# Patient Record
Sex: Male | Born: 1960 | Race: Black or African American | Hispanic: No | Marital: Single | Smoking: Never smoker
Health system: Southern US, Community
[De-identification: ages and names within clinical notes are randomized; demographics above are authoritative.]

## PROBLEM LIST (undated history)

## (undated) DIAGNOSIS — I10 Essential (primary) hypertension: Secondary | ICD-10-CM

## (undated) DIAGNOSIS — E785 Hyperlipidemia, unspecified: Secondary | ICD-10-CM

## (undated) HISTORY — DX: Hyperlipidemia, unspecified: E78.5

## (undated) HISTORY — DX: Essential (primary) hypertension: I10

---

## 2010-10-19 ENCOUNTER — Emergency Department (HOSPITAL_COMMUNITY): Payer: Self-pay

## 2010-10-19 ENCOUNTER — Emergency Department (HOSPITAL_COMMUNITY)
Admission: EM | Admit: 2010-10-19 | Discharge: 2010-10-19 | Disposition: A | Discharge status: Home / Self Care | Payer: Self-pay | Attending: Emergency Medicine | Admitting: Emergency Medicine

## 2010-10-19 DIAGNOSIS — R197 Diarrhea, unspecified: Secondary | ICD-10-CM | POA: Insufficient documentation

## 2010-10-19 DIAGNOSIS — R062 Wheezing: Secondary | ICD-10-CM | POA: Insufficient documentation

## 2010-10-19 DIAGNOSIS — Z79899 Other long term (current) drug therapy: Secondary | ICD-10-CM | POA: Insufficient documentation

## 2010-10-19 DIAGNOSIS — R059 Cough, unspecified: Secondary | ICD-10-CM | POA: Insufficient documentation

## 2010-10-19 DIAGNOSIS — B9789 Other viral agents as the cause of diseases classified elsewhere: Secondary | ICD-10-CM | POA: Insufficient documentation

## 2010-10-19 DIAGNOSIS — R5381 Other malaise: Secondary | ICD-10-CM | POA: Insufficient documentation

## 2010-10-19 DIAGNOSIS — I10 Essential (primary) hypertension: Secondary | ICD-10-CM | POA: Insufficient documentation

## 2010-10-19 DIAGNOSIS — R0989 Other specified symptoms and signs involving the circulatory and respiratory systems: Secondary | ICD-10-CM | POA: Insufficient documentation

## 2010-10-19 DIAGNOSIS — R05 Cough: Secondary | ICD-10-CM | POA: Insufficient documentation

## 2010-10-19 DIAGNOSIS — R509 Fever, unspecified: Secondary | ICD-10-CM | POA: Insufficient documentation

## 2010-10-19 DIAGNOSIS — J40 Bronchitis, not specified as acute or chronic: Secondary | ICD-10-CM | POA: Insufficient documentation

## 2010-10-19 DIAGNOSIS — J3489 Other specified disorders of nose and nasal sinuses: Secondary | ICD-10-CM | POA: Insufficient documentation

## 2010-10-19 LAB — COMPREHENSIVE METABOLIC PANEL
ALT: 20 U/L (ref 0–53)
AST: 32 U/L (ref 0–37)
Albumin: 3.5 g/dL (ref 3.5–5.2)
Alkaline Phosphatase: 60 U/L (ref 39–117)
BUN: 8 mg/dL (ref 6–23)
Chloride: 100 mEq/L (ref 96–112)
GFR calc Af Amer: >60 mL/min (ref >60)
Potassium: 3.9 mEq/L (ref 3.5–5.1)
Sodium: 133 mEq/L — ABNORMAL LOW (ref 135–145)
Total Protein: 7.5 g/dL (ref 6.0–8.3)

## 2010-10-19 LAB — CBC
Platelets: 217 10*3/uL (ref 150–400)
RBC: 4.49 MIL/uL (ref 4.22–5.81)
RDW: 13.6 % (ref 11.5–15.5)
WBC: 7.2 10*3/uL (ref 4.0–10.5)

## 2010-10-19 LAB — DIFFERENTIAL
Basophils Absolute: 0 10*3/uL (ref 0.0–0.1)
Basophils Relative: 0 % (ref 0–1)
Eosinophils Absolute: 0 10*3/uL (ref 0.0–0.7)
Eosinophils Relative: 0 % (ref 0–5)
Neutrophils Relative %: 75 % (ref 43–77)

## 2017-07-05 ENCOUNTER — Emergency Department (HOSPITAL_COMMUNITY)
Admission: EM | Admit: 2017-07-05 | Discharge: 2017-07-05 | Disposition: A | Discharge status: Home / Self Care | Payer: Self-pay | Attending: Emergency Medicine | Admitting: Emergency Medicine

## 2017-07-05 ENCOUNTER — Encounter (HOSPITAL_COMMUNITY): Payer: Self-pay | Admitting: Emergency Medicine

## 2017-07-05 DIAGNOSIS — Z79899 Other long term (current) drug therapy: Secondary | ICD-10-CM | POA: Insufficient documentation

## 2017-07-05 DIAGNOSIS — K625 Hemorrhage of anus and rectum: Secondary | ICD-10-CM | POA: Insufficient documentation

## 2017-07-05 LAB — COMPREHENSIVE METABOLIC PANEL
ALBUMIN: 3.9 g/dL (ref 3.5–5.0)
ALK PHOS: 79 U/L (ref 38–126)
ALT: 29 U/L (ref 17–63)
ANION GAP: 6 (ref 5–15)
AST: 28 U/L (ref 15–41)
BILIRUBIN TOTAL: 0.6 mg/dL (ref 0.3–1.2)
BUN: 14 mg/dL (ref 6–20)
CALCIUM: 9.4 mg/dL (ref 8.9–10.3)
CO2: 27 mmol/L (ref 22–32)
Chloride: 104 mmol/L (ref 101–111)
Creatinine, Ser: 0.95 mg/dL (ref 0.61–1.24)
GFR calc Af Amer: >60 mL/min (ref >60)
GLUCOSE: 93 mg/dL (ref 65–99)
POTASSIUM: 3.7 mmol/L (ref 3.5–5.1)
Sodium: 137 mmol/L (ref 135–145)
TOTAL PROTEIN: 7.6 g/dL (ref 6.5–8.1)

## 2017-07-05 LAB — CBC
HCT: 39 % (ref 39.0–52.0)
HEMOGLOBIN: 12.8 g/dL — AB (ref 13.0–17.0)
MCH: 30.8 pg (ref 26.0–34.0)
MCHC: 32.8 g/dL (ref 30.0–36.0)
MCV: 93.8 fL (ref 78.0–100.0)
Platelets: 213 10*3/uL (ref 150–400)
RBC: 4.16 MIL/uL — ABNORMAL LOW (ref 4.22–5.81)
RDW: 13.1 % (ref 11.5–15.5)
WBC: 4.5 10*3/uL (ref 4.0–10.5)

## 2017-07-05 LAB — TYPE AND SCREEN
ABO/RH(D): O POS
ANTIBODY SCREEN: NEGATIVE

## 2017-07-05 LAB — POC OCCULT BLOOD, ED: Fecal Occult Bld: POSITIVE — AB

## 2017-07-05 LAB — ABO/RH: ABO/RH(D): O POS

## 2017-07-05 MED ORDER — POLYETHYLENE GLYCOL 3350 17 GM/SCOOP PO POWD: 17.0000 g | Freq: Every day | ORAL | 0 refills | Status: DC

## 2017-07-05 NOTE — Discharge Instructions (Signed)
Your rectal bleeding is most likely due to hemorrhoids.  Please take MiraLAX to help soften your stools more.  This should help with the bleeding.  You have also been given the phone number to call a gastroenterologist for further workup of your bleeding if it is persistent or worsening.  Please return without fail for worsening symptoms, including passing out, extreme fatigue, worsening bleeding, black or tarry stools, severe abdominal pains, intractable vomiting, or any other symptoms concerning to you

## 2017-07-05 NOTE — ED Notes (Signed)
Pt reports being concerned that he has a lump in his groin area. He states it is sometimes there and sometimes is goes away. Pt also reports he "has a touch of bronchitis" and "coughs up white stuff".

## 2017-07-05 NOTE — ED Provider Notes (Signed)
[facility]-EMERGENCY DEPT Provider Note   CSN: [id number] Arrival date & time: 07/05/17  1001     History   Chief Complaint Chief Complaint  Patient presents with  . Rectal Bleeding    HPI Jake Fisher is a 56 y.o. male.  HPI 56 year old male who presents with bright red blood per rectum for 5 days with after bowel movement. Noticed blood that lightly stains toilet bowel water and with wiping with toilet paper.  Denies melena or hematochezia, abdominal pain, fevers or chills, nausea or vomiting. Does not take anticoagulants.  Does also report that when he bears down to have a bowel movement he notices a slight swelling in the left groin.  It is not painful, and it goes away on its own.  Denies abdominal distention, or any urinary complaints.  Denies testicular swelling or pain. Denies constipation or diarrhea.   History reviewed. No pertinent past medical history.  There are no active problems to display for this patient.   History reviewed. No pertinent surgical history.     Home Medications    Prior to Admission medications   Medication Sig Start Date End Date Taking? Authorizing Provider  COD LIVER OIL PO Take 2-3 capsules by mouth daily.   Yes [provider]  Ferrous Sulfate (FERRA PO) Take 5 mLs by mouth daily after supper.   Yes [provider]  polyethylene glycol powder (MIRALAX) powder Take 17 g by mouth daily. 07/05/17   Lavera GuiseLiu, Ronnie Doo Duo, MD    Family History No family history on file.  Social History Social History   Tobacco Use  . Smoking status: Never Smoker  Substance Use Topics  . Alcohol use: Yes  . Drug use: No     Allergies   Patient has no known allergies.   Review of Systems Review of Systems  Respiratory: Negative for shortness of breath.   Cardiovascular: Negative for chest pain.  Gastrointestinal: Positive for blood in stool. Negative for abdominal pain, diarrhea, nausea, rectal pain and  vomiting.  Genitourinary: Negative for dysuria and frequency.  Hematological: Does not bruise/bleed easily.  All other systems reviewed and are negative.    Physical Exam Updated Vital Signs BP 127/69 (BP Location: Right Arm)   Pulse 66   Temp 98.4 F (36.9 C) (Oral)   Resp 16   Ht 5\' 7"  (1.702 m)   Wt 93 kg (205 lb)   SpO2 100%   BMI 32.11 kg/m   Physical Exam Physical Exam  Nursing note and vitals reviewed. Constitutional: Well developed, well nourished, non-toxic, and in no acute distress Head: Normocephalic and atraumatic.  Mouth/Throat: Oropharynx is clear and moist.  Neck: Normal range of motion. Neck supple.  Cardiovascular: Normal rate and regular rhythm.   Pulmonary/Chest: Effort normal and breath sounds normal.  Abdominal: Soft. There is no tenderness. There is no rebound and no guarding. small external hemorrhoid, not thrombosed. No fissures. No stool in rectum Musculoskeletal: Normal range of motion.  Neurological: Alert, no facial droop, fluent speech, moves all extremities symmetrically Skin: Skin is warm and dry.  Psychiatric: Cooperative   ED Treatments / Results  Labs (all labs ordered are listed, but only abnormal results are displayed) Labs Reviewed  CBC - Abnormal; Notable for the following components:      Result Value   RBC 4.16 (*)    Hemoglobin 12.8 (*)    All other components within normal limits  POC OCCULT BLOOD, ED - Abnormal; Notable for  the following components:   Fecal Occult Bld POSITIVE (*)    All other components within normal limits  COMPREHENSIVE METABOLIC PANEL  TYPE AND SCREEN  ABO/RH    EKG  EKG Interpretation None       Radiology No results found.  Procedures Procedures (including critical care time)  Medications Ordered in ED Medications - No data to display   Initial Impression / Assessment and Plan / ED Course  I have reviewed the triage vital signs and the nursing notes.  Pertinent labs & imaging  results that were available during my care of the patient were reviewed by me and considered in my medical decision making (see chart for details).     56 year old male who presents with rectal bleeding.  He is well-appearing in no acute distress normal vital signs.  She is a soft and benign abdomen.  Small external hemorrhoid noted on exam without fissures.  No stools in the rectum, but small streak of mucus that is guaiac positive for blood.  Suspect that this is likely hemorrhoidal bleeding as it does sound like he strains with bowel movements.  He also likely has hernia in the left groin with straining although it is not appreciated on exam today. Soft and benign abdomen on exam.  Blood work reassuring.  Hgb 12.8, close to baseline.  I have started him on MiraLAX.  GI referral given as needed. Strict return and follow-up instructions reviewed. He expressed understanding of all discharge instructions and felt comfortable with the plan of care.   Final Clinical Impressions(s) / ED Diagnoses   Final diagnoses:  Bright red blood per rectum    ED Discharge Orders        Ordered    polyethylene glycol powder (MIRALAX) powder  Daily     07/05/17 1351       Lavera GuiseLiu, Flois Mctague Duo, MD 07/05/17 1353

## 2017-07-05 NOTE — ED Notes (Signed)
ED Provider at bedside. 

## 2017-07-05 NOTE — ED Triage Notes (Addendum)
Per pt, states he has been having blood in his stool for 5 days-states scant amount-states he is not sure if he has hemorrhoids-states also has palpable mass that "goes in and out" on left groin-no abdominal pain, no N/V-no dizziness

## 2019-05-08 ENCOUNTER — Other Ambulatory Visit: Payer: Self-pay

## 2019-05-08 DIAGNOSIS — Z20822 Contact with and (suspected) exposure to covid-19: Secondary | ICD-10-CM

## 2019-05-09 LAB — NOVEL CORONAVIRUS, NAA: SARS-CoV-2, NAA: NOT DETECTED

## 2019-05-15 ENCOUNTER — Other Ambulatory Visit: Payer: Self-pay

## 2019-05-15 DIAGNOSIS — Z20822 Contact with and (suspected) exposure to covid-19: Secondary | ICD-10-CM

## 2019-05-17 LAB — NOVEL CORONAVIRUS, NAA: SARS-CoV-2, NAA: NOT DETECTED

## 2019-05-21 ENCOUNTER — Other Ambulatory Visit: Payer: Self-pay

## 2019-05-21 DIAGNOSIS — Z20822 Contact with and (suspected) exposure to covid-19: Secondary | ICD-10-CM

## 2019-05-22 LAB — SPECIMEN STATUS REPORT

## 2019-05-22 LAB — NOVEL CORONAVIRUS, NAA: SARS-CoV-2, NAA: NOT DETECTED

## 2019-05-28 ENCOUNTER — Other Ambulatory Visit: Payer: Self-pay

## 2019-05-28 DIAGNOSIS — Z20822 Contact with and (suspected) exposure to covid-19: Secondary | ICD-10-CM

## 2019-05-30 LAB — NOVEL CORONAVIRUS, NAA: SARS-CoV-2, NAA: NOT DETECTED

## 2019-06-04 ENCOUNTER — Other Ambulatory Visit: Payer: Self-pay

## 2019-06-04 DIAGNOSIS — Z20822 Contact with and (suspected) exposure to covid-19: Secondary | ICD-10-CM

## 2019-06-05 LAB — NOVEL CORONAVIRUS, NAA: SARS-CoV-2, NAA: NOT DETECTED

## 2019-06-18 ENCOUNTER — Other Ambulatory Visit: Payer: Self-pay

## 2019-06-18 DIAGNOSIS — Z20822 Contact with and (suspected) exposure to covid-19: Secondary | ICD-10-CM

## 2019-06-19 LAB — NOVEL CORONAVIRUS, NAA: SARS-CoV-2, NAA: NOT DETECTED

## 2021-01-05 ENCOUNTER — Emergency Department (HOSPITAL_COMMUNITY)
Admission: EM | Admit: 2021-01-05 | Discharge: 2021-01-05 | Disposition: A | Discharge status: Home / Self Care | Payer: Self-pay | Attending: Emergency Medicine | Admitting: Emergency Medicine

## 2021-01-05 ENCOUNTER — Other Ambulatory Visit: Payer: Self-pay

## 2021-01-05 ENCOUNTER — Encounter (HOSPITAL_COMMUNITY): Payer: Self-pay | Admitting: *Deleted

## 2021-01-05 DIAGNOSIS — U071 COVID-19: Secondary | ICD-10-CM | POA: Insufficient documentation

## 2021-01-05 LAB — BASIC METABOLIC PANEL
Anion gap: 6 (ref 5–15)
BUN: 12 mg/dL (ref 6–20)
CO2: 29 mmol/L (ref 22–32)
Calcium: 8.7 mg/dL — ABNORMAL LOW (ref 8.9–10.3)
Chloride: 101 mmol/L (ref 98–111)
Creatinine, Ser: 1.05 mg/dL (ref 0.61–1.24)
GFR, Estimated: >60 mL/min (ref >60)
Glucose, Bld: 101 mg/dL — ABNORMAL HIGH (ref 70–99)
Potassium: 3.1 mmol/L — ABNORMAL LOW (ref 3.5–5.1)
Sodium: 136 mmol/L (ref 135–145)

## 2021-01-05 MED ORDER — NIRMATRELVIR/RITONAVIR (PAXLOVID)TABLET: 3.0000 | ORAL_TABLET | Freq: Two times a day (BID) | ORAL | 0 refills | Status: AC

## 2021-01-05 MED ORDER — BENZONATATE 100 MG PO CAPS: 100.0000 mg | ORAL_CAPSULE | Freq: Three times a day (TID) | ORAL | 0 refills | Status: DC

## 2021-01-05 NOTE — Discharge Instructions (Signed)
Please read and follow all provided instructions.  Your diagnoses today include:  1. COVID-19     Tests performed today include:  Vital signs. See below for your results today.   Kidney function - was normal  Medications prescribed:   Tessalon Perles - cough suppressant medication   Paxlovid - antiviral medication for covid  Take any prescribed medications only as directed. Treatment for your infection is aimed at treating the symptoms. There are no medications, such as antibiotics, that will cure your infection.   Home care instructions:  Follow any educational materials contained in this packet.   Your illness is contagious and can be spread to others, especially during the first 3 or 4 days. It cannot be cured by antibiotics or other medicines. Take basic precautions such as washing your hands often, covering your mouth when you cough or sneeze, and avoiding public places where you could spread your illness to others.   Please continue drinking plenty of fluids.  Use over-the-counter medicines as needed as directed on packaging for symptom relief.  You may also use ibuprofen or tylenol as directed on packaging for pain or fever.  Do not take multiple medicines containing Tylenol or acetaminophen to avoid taking too much of this medication.  If you are positive for Covid-19, you should isolate yourself and not be exposed to other people for 5 days after your symptoms began. If you are not feeling better at day 5, you need to isolate yourself for a total of 10 days. If you are feeling better by day 5, you should wear a mask properly, over your nose and mouth, at all times while around other people until 10 days after your symptoms started.   Follow-up instructions: Please follow-up with your primary care provider as needed for further evaluation of your symptoms if you are not feeling better.   Return instructions:   Please return to the Emergency Department if you experience  worsening symptoms.   Return to the emergency department if you have worsening shortness of breath breathing or increased work of breathing, persistent vomiting  RETURN IMMEDIATELY IF you develop shortness of breath, confusion or altered mental status, a new rash, become dizzy, faint, or poorly responsive, or are unable to be cared for at home.  Please return if you have persistent vomiting and cannot keep down fluids or develop a fever that is not controlled by tylenol or motrin.    Please return if you have any other emergent concerns.  Additional Information:  Your vital signs today were: BP (!) 149/95 (BP Location: Left Arm)   Pulse 95   Temp 98.5 F (36.9 C) (Oral)   Resp 16   Ht 5\' 7"  (1.702 m)   Wt 88.5 kg   SpO2 98%   BMI 30.54 kg/m  If your blood pressure (BP) was elevated above 135/85 this visit, please have this repeated by your doctor within one month. --------------

## 2021-01-05 NOTE — ED Provider Notes (Signed)
[facility]   CSN: [id number] Arrival date & time: 01/05/21  1025     History Chief Complaint  Patient presents with  . Covid Positive    Jake Fisher is a 60 y.o. male.  Patient presents to the emergency department today for COVID symptoms.  Patient states that he began getting a cough about 3 days ago.  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  He tested positive for COVID this morning.  He has had some congestion.  He has had some abdominal pain but only when he coughs hard.  No vomiting.  He does not feel short of breath.  No reported fevers.  Mild sore throat.  He received COVID-vaccine, no boosters. The onset of this condition was acute. The course is constant. Aggravating factors: none. Alleviating factors: none.          History reviewed. No pertinent past medical history.  There are no problems to display for this patient.   History reviewed. No pertinent surgical history.     No family history on file.  Social History   Tobacco Use  . Smoking status: Never Smoker  . Smokeless tobacco: Never Used  Substance Use Topics  . Alcohol use: Yes  . Drug use: No    Home Medications Prior to Admission medications   Medication Sig Start Date End Date Taking? Authorizing Provider  COD LIVER OIL PO Take 2-3 capsules by mouth daily.    [provider]  Ferrous Sulfate (FERRA PO) Take 5 mLs by mouth daily after supper.    [provider]  polyethylene glycol powder (MIRALAX) powder Take 17 g by mouth daily. 07/05/17   Lavera Guise, MD    Allergies    Patient has no known allergies.  Review of Systems   Review of Systems  Constitutional: Negative for chills, fatigue and fever.  HENT: Positive for congestion and sore throat. Negative for ear pain, rhinorrhea and sinus pressure.   Eyes: Negative for redness.  Respiratory: Positive for cough. Negative for wheezing.   Gastrointestinal: Positive for  abdominal pain (with coughing) and diarrhea. Negative for nausea and vomiting.  Genitourinary: Negative for dysuria.  Musculoskeletal: Negative for myalgias and neck stiffness.  Skin: Negative for rash.  Neurological: Negative for headaches.  Hematological: Negative for adenopathy.    Physical Exam Updated Vital Signs BP (!) 149/95 (BP Location: Left Arm)   Pulse 95   Temp 98.5 F (36.9 C) (Oral)   Resp 16   Ht 5\' 7"  (1.702 m)   Wt 88.5 kg   SpO2 98%   BMI 30.54 kg/m   Physical Exam Vitals and nursing note reviewed.  Constitutional:      Appearance: He is well-developed.  HENT:     Head: Normocephalic and atraumatic.     Jaw: No trismus.     Right Ear: Tympanic membrane, ear canal and external ear normal.     Left Ear: Tympanic membrane, ear canal and external ear normal.     Nose: Nose normal. No mucosal edema or rhinorrhea.     Mouth/Throat:     Mouth: Mucous membranes are not dry.     Pharynx: Uvula midline. Posterior oropharyngeal erythema present. No oropharyngeal exudate or uvula swelling.     Tonsils: No tonsillar abscesses.  Eyes:     General:        Right eye: No discharge.        Left eye: No discharge.  Conjunctiva/sclera: Conjunctivae normal.  Cardiovascular:     Rate and Rhythm: Normal rate and regular rhythm.     Heart sounds: Normal heart sounds.  Pulmonary:     Effort: Pulmonary effort is normal. No respiratory distress.     Breath sounds: Normal breath sounds. No wheezing or rales.  Abdominal:     Palpations: Abdomen is soft.     Tenderness: There is no abdominal tenderness.  Musculoskeletal:     Cervical back: Normal range of motion and neck supple.  Skin:    General: Skin is warm and dry.  Neurological:     Mental Status: He is alert.     ED Results / Procedures / Treatments   Labs (all labs ordered are listed, but only abnormal results are displayed) Labs Reviewed  BASIC METABOLIC PANEL - Abnormal; Notable for the following  components:      Result Value   Potassium 3.1 (*)    Glucose, Bld 101 (*)    Calcium 8.7 (*)    All other components within normal limits    EKG None  Radiology No results found.  Procedures Procedures   Medications Ordered in ED Medications - No data to display  ED Course  I have reviewed the triage vital signs and the nursing notes.  Pertinent labs & imaging results that were available during my care of the patient were reviewed by me and considered in my medical decision making (see chart for details).  Patient seen and examined.  Discussed symptom control with patient.  Discussed COVID antiviral medication.  Discussed risks and benefits.  Patient is interested.  We will need to check kidney function prior to discharge as we do not have any recent lab work.  Vital signs reviewed and are as follows: BP (!) 149/95 (BP Location: Left Arm)   Pulse 95   Temp 98.5 F (36.9 C) (Oral)   Resp 16   Ht 5\' 7"  (1.702 m)   Wt 88.5 kg   SpO2 98%   BMI 30.54 kg/m   Labs show mild hypokalemia, normal GFR.  Detailed discussion had with with patient regarding COVID-19 precautions and written instructions given as well.  We discussed need to isolate themselves for 5 days from onset of symptoms and have 24 hours of improvement prior to breaking isolation.  We discussed that when breaking isolation, mask wearing for 5 additional days is required.  We discussed signs symptoms to return which include worsening shortness of breath, trouble breathing, or increased work of breathing.  Also return with persistent vomiting, confusion, passing out, or if they have any other concerns. Counseled on the need for rest and good hydration. Discussed that high-risk contacts should be aware of positive result and they need to quarantine and be tested if they develop any symptoms. Patient verbalizes understanding.   Jake Fisher was evaluated in Emergency Department on 01/05/2021 for the symptoms described in  the history of present illness. He was evaluated in the context of the global COVID-19 pandemic, which necessitated consideration that the patient might be at risk for infection with the SARS-CoV-2 virus that causes COVID-19. Institutional protocols and algorithms that pertain to the evaluation of patients at risk for COVID-19 are in a state of rapid change based on information released by regulatory bodies including the CDC and federal and state organizations. These policies and algorithms were followed during the patient's care in the ED.    MDM Rules/Calculators/A&P  COVID symptoms, overall mild. BMI > 30, offered rx paxlovid, written. Looks well. No concern for PNA.    Final Clinical Impression(s) / ED Diagnoses Final diagnoses:  COVID-19    Rx / DC Orders ED Discharge Orders         Ordered    benzonatate (TESSALON) 100 MG capsule  Every 8 hours        01/05/21 1215    nirmatrelvir/ritonavir EUA (PAXLOVID) TABS  2 times daily        01/05/21 1215           Renne Crigler, PA-C 01/05/21 1223    Lorre Nick, MD 01/09/21 [id number]

## 2021-01-05 NOTE — ED Triage Notes (Signed)
Tested Covid + this morning, Cough which makes stomach hurt only when coughing.

## 2021-03-06 ENCOUNTER — Emergency Department (HOSPITAL_COMMUNITY): Payer: Self-pay

## 2021-03-06 ENCOUNTER — Emergency Department (HOSPITAL_COMMUNITY)
Admission: EM | Admit: 2021-03-06 | Discharge: 2021-03-06 | Disposition: A | Discharge status: Home / Self Care | Payer: Self-pay | Attending: Emergency Medicine | Admitting: Emergency Medicine

## 2021-03-06 ENCOUNTER — Encounter (HOSPITAL_COMMUNITY): Payer: Self-pay

## 2021-03-06 ENCOUNTER — Other Ambulatory Visit: Payer: Self-pay

## 2021-03-06 DIAGNOSIS — I10 Essential (primary) hypertension: Secondary | ICD-10-CM | POA: Insufficient documentation

## 2021-03-06 DIAGNOSIS — R5383 Other fatigue: Secondary | ICD-10-CM | POA: Insufficient documentation

## 2021-03-06 DIAGNOSIS — R109 Unspecified abdominal pain: Secondary | ICD-10-CM | POA: Insufficient documentation

## 2021-03-06 DIAGNOSIS — R531 Weakness: Secondary | ICD-10-CM | POA: Insufficient documentation

## 2021-03-06 LAB — COMPREHENSIVE METABOLIC PANEL
ALT: 17 U/L (ref 0–44)
AST: 20 U/L (ref 15–41)
Albumin: 4 g/dL (ref 3.5–5.0)
Alkaline Phosphatase: 72 U/L (ref 38–126)
Anion gap: 6 (ref 5–15)
BUN: 17 mg/dL (ref 8–23)
CO2: 28 mmol/L (ref 22–32)
Calcium: 9.3 mg/dL (ref 8.9–10.3)
Chloride: 106 mmol/L (ref 98–111)
Creatinine, Ser: 0.72 mg/dL (ref 0.61–1.24)
GFR, Estimated: >60 mL/min (ref >60)
Glucose, Bld: 93 mg/dL (ref 70–99)
Potassium: 4.1 mmol/L (ref 3.5–5.1)
Sodium: 140 mmol/L (ref 135–145)
Total Bilirubin: 0.4 mg/dL (ref 0.3–1.2)
Total Protein: 7.5 g/dL (ref 6.5–8.1)

## 2021-03-06 LAB — CBC WITH DIFFERENTIAL/PLATELET
Abs Immature Granulocytes: 0.02 10*3/uL (ref 0.00–0.07)
Basophils Absolute: 0 10*3/uL (ref 0.0–0.1)
Basophils Relative: 1 %
Eosinophils Absolute: 0.2 10*3/uL (ref 0.0–0.5)
Eosinophils Relative: 3 %
HCT: 37.4 % — ABNORMAL LOW (ref 39.0–52.0)
Hemoglobin: 12 g/dL — ABNORMAL LOW (ref 13.0–17.0)
Immature Granulocytes: 0 %
Lymphocytes Relative: 41 %
Lymphs Abs: 2.1 10*3/uL (ref 0.7–4.0)
MCH: 30.8 pg (ref 26.0–34.0)
MCHC: 32.1 g/dL (ref 30.0–36.0)
MCV: 95.9 fL (ref 80.0–100.0)
Monocytes Absolute: 0.4 10*3/uL (ref 0.1–1.0)
Monocytes Relative: 9 %
Neutro Abs: 2.4 10*3/uL (ref 1.7–7.7)
Neutrophils Relative %: 46 %
Platelets: 233 10*3/uL (ref 150–400)
RBC: 3.9 MIL/uL — ABNORMAL LOW (ref 4.22–5.81)
RDW: 14.3 % (ref 11.5–15.5)
WBC: 5.2 10*3/uL (ref 4.0–10.5)
nRBC: 0 % (ref 0.0–0.2)

## 2021-03-06 LAB — TROPONIN I (HIGH SENSITIVITY)
Troponin I (High Sensitivity): 5 ng/L (ref <18)
Troponin I (High Sensitivity): 5 ng/L (ref <18)

## 2021-03-06 NOTE — Discharge Instructions (Signed)
You were seen in the emergency department for feeling fatigued and having elevated blood pressure.  You had lab work chest x-ray and an EKG that did not show any significant abnormalities.  You will be important for you to establish care with a primary care doctor so this can be followed.  Return to the emergency department if any worsening or concerning symptoms

## 2021-03-06 NOTE — ED Triage Notes (Signed)
Patient reports that he was starting work today and was feeling weak and tired. Patient states they checked his BP and told him it was high and needed to go to the ED.  BP-182/105 in triage.

## 2021-03-06 NOTE — ED Provider Notes (Signed)
[facility]-EMERGENCY DEPT Provider Note   CSN: [id number] Arrival date & time: 03/06/21  0945     History Chief Complaint  Patient presents with   Hypertension    Jake Fisher is a 60 y.o. male.  He said he woke up this morning of feeling weak and tired.  Had some intermittent pain in his chest and some shortness of breath with exertion.  He went to work but still did not feel good and the nurse checked his blood pressure and found it to be high.  He denies any prior history of hypertension although he does not see a doctor.  He is not on any regular medications.  He denies any cough.  He had some abdominal pain 2 days ago but that resolved.  No vomiting or diarrhea no urinary symptoms.  No diaphoresis.  The history is provided by the patient.  Weakness Severity:  Moderate Onset quality:  Gradual Duration:  5 hours Timing:  Intermittent Progression:  Unchanged Chronicity:  New Relieved by:  None tried Worsened by:  Activity Ineffective treatments:  None tried Associated symptoms: abdominal pain, chest pain and shortness of breath   Associated symptoms: no cough, no diarrhea, no dysuria, no falls, no fever, no loss of consciousness and no vision change       History reviewed. No pertinent past medical history.  There are no problems to display for this patient.   History reviewed. No pertinent surgical history.     Family History  Problem Relation Age of Onset   Diabetes Mother     Social History   Tobacco Use   Smoking status: Never   Smokeless tobacco: Never  Vaping Use   Vaping Use: Never used  Substance Use Topics   Alcohol use: Yes   Drug use: No    Home Medications Prior to Admission medications   Medication Sig Start Date End Date Taking? Authorizing Provider  benzonatate (TESSALON) 100 MG capsule Take 1 capsule (100 mg total) by mouth every 8 (eight) hours. 01/05/21   Renne Crigler, PA-C  COD LIVER OIL PO Take 2-3 capsules by  mouth daily.    [provider]  Ferrous Sulfate (FERRA PO) Take 5 mLs by mouth daily after supper.    [provider]  polyethylene glycol powder (MIRALAX) powder Take 17 g by mouth daily. 07/05/17   Lavera Guise, MD    Allergies    Patient has no known allergies.  Review of Systems   Review of Systems  Constitutional:  Negative for fever.  HENT:  Negative for sore throat.   Eyes:  Negative for visual disturbance.  Respiratory:  Positive for shortness of breath. Negative for cough.   Cardiovascular:  Positive for chest pain.  Gastrointestinal:  Positive for abdominal pain. Negative for diarrhea.  Genitourinary:  Negative for dysuria.  Musculoskeletal:  Negative for falls and neck pain.  Skin:  Negative for rash.  Neurological:  Positive for weakness. Negative for loss of consciousness.   Physical Exam Updated Vital Signs BP (!) 185/105 (BP Location: Left Arm)   Pulse 96   Temp 98.5 F (36.9 C) (Oral)   Resp 16   Ht 5\' 7"  (1.702 m)   Wt 88.5 kg   SpO2 99%   BMI 30.54 kg/m   Physical Exam Vitals and nursing note reviewed.  Constitutional:      Appearance: Normal appearance. He is well-developed.  HENT:     Head: Normocephalic and atraumatic.  Eyes:  Conjunctiva/sclera: Conjunctivae normal.  Cardiovascular:     Rate and Rhythm: Normal rate and regular rhythm.     Heart sounds: No murmur heard. Pulmonary:     Effort: Pulmonary effort is normal. No respiratory distress.     Breath sounds: Normal breath sounds.  Abdominal:     Palpations: Abdomen is soft.     Tenderness: There is no abdominal tenderness. There is no guarding or rebound.  Musculoskeletal:        General: No deformity or signs of injury. Normal range of motion.     Cervical back: Neck supple.     Right lower leg: No edema.     Left lower leg: No edema.  Skin:    General: Skin is warm and dry.  Neurological:     General: No focal deficit present.     Mental Status: He is alert  and oriented to person, place, and time.     Cranial Nerves: No cranial nerve deficit.     Sensory: No sensory deficit.     Motor: No weakness.    ED Results / Procedures / Treatments   Labs (all labs ordered are listed, but only abnormal results are displayed) Labs Reviewed  CBC WITH DIFFERENTIAL/PLATELET - Abnormal; Notable for the following components:      Result Value   RBC 3.90 (*)    Hemoglobin 12.0 (*)    HCT 37.4 (*)    All other components within normal limits  COMPREHENSIVE METABOLIC PANEL  TROPONIN I (HIGH SENSITIVITY)  TROPONIN I (HIGH SENSITIVITY)    EKG EKG Interpretation  Date/Time:  Friday March 06 2021 11:45:43 EDT Ventricular Rate:  84 PR Interval:  155 QRS Duration: 97 QT Interval:  399 QTC Calculation: 472 R Axis:   89 Text Interpretation: Sinus rhythm Biatrial enlargement Borderline right axis deviation No old tracing to compare Confirmed by Meridee Score [phone]) on 03/06/2021 11:49:52 AM  Radiology DG Chest Port 1 View  Result Date: 03/06/2021 CLINICAL DATA:  Shortness of breath, fatigue and dyspnea EXAM: PORTABLE CHEST 1 VIEW COMPARISON:  10/19/2010 FINDINGS: The heart size and mediastinal contours are within normal limits. Both lungs are clear. Thoracolumbar scoliosis. IMPRESSION: No active disease. Electronically Signed   By: Signa Kell M.D.   On: 03/06/2021 12:29    Procedures Procedures   Medications Ordered in ED Medications - No data to display  ED Course  I have reviewed the triage vital signs and the nursing notes.  Pertinent labs & imaging results that were available during my care of the patient were reviewed by me and considered in my medical decision making (see chart for details).  Clinical Course as of 03/06/21 1859  Fri Mar 06, 2021  1226 Chest x-ray interpreted by me as no acute infiltrates.  Awaiting radiology reading. [MB]  1515 Reviewed results with patient.  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  I recommended that he get a primary care doctor once he establishes insurance.  Return instructions discussed [MB]    Clinical Course User Index [MB] Terrilee Files, MD   MDM Rules/Calculators/A&P                          This patient complains of fatigue elevated blood pressure vague chest pain; this involves an extensive number of treatment Options and is a complaint that carries with it a high risk of complications and Morbidity. The  differential includes ACS, pneumonia, PE, anemia, hypertension, renal failure, metabolic derangement  I ordered, reviewed and interpreted labs, which included CBC with normal white count, hemoglobin low unclear baseline, chemistries and LFTs normal, troponins flat  I ordered imaging studies which included chest x-ray and I independently    visualized and interpreted imaging which showed no acute findings  Previous records obtained and reviewed in epic no recent visits  After the interventions stated above, I reevaluated the patient and found patient's blood pressure still to be mildly elevated.  Otherwise vitals are unremarkable including normal pulse ox.  Appears in no distress.  Recommended establishing care with primary care doctor to possibly get started on some blood pressure medications.  Return instructions discussed   Final Clinical Impression(s) / ED Diagnoses Final diagnoses:  Hypertension, unspecified type  Fatigue, unspecified type    Rx / DC Orders ED Discharge Orders     None        Terrilee Files, MD 03/06/21 1901

## 2022-06-06 IMAGING — DX DG CHEST 1V PORT
1 series · 1 of 1 positions shown · non-contrast
Comparison: 10/19/2010

CLINICAL DATA: Shortness of breath, fatigue and dyspnea

EXAM:
PORTABLE CHEST 1 VIEW

[chest ap]
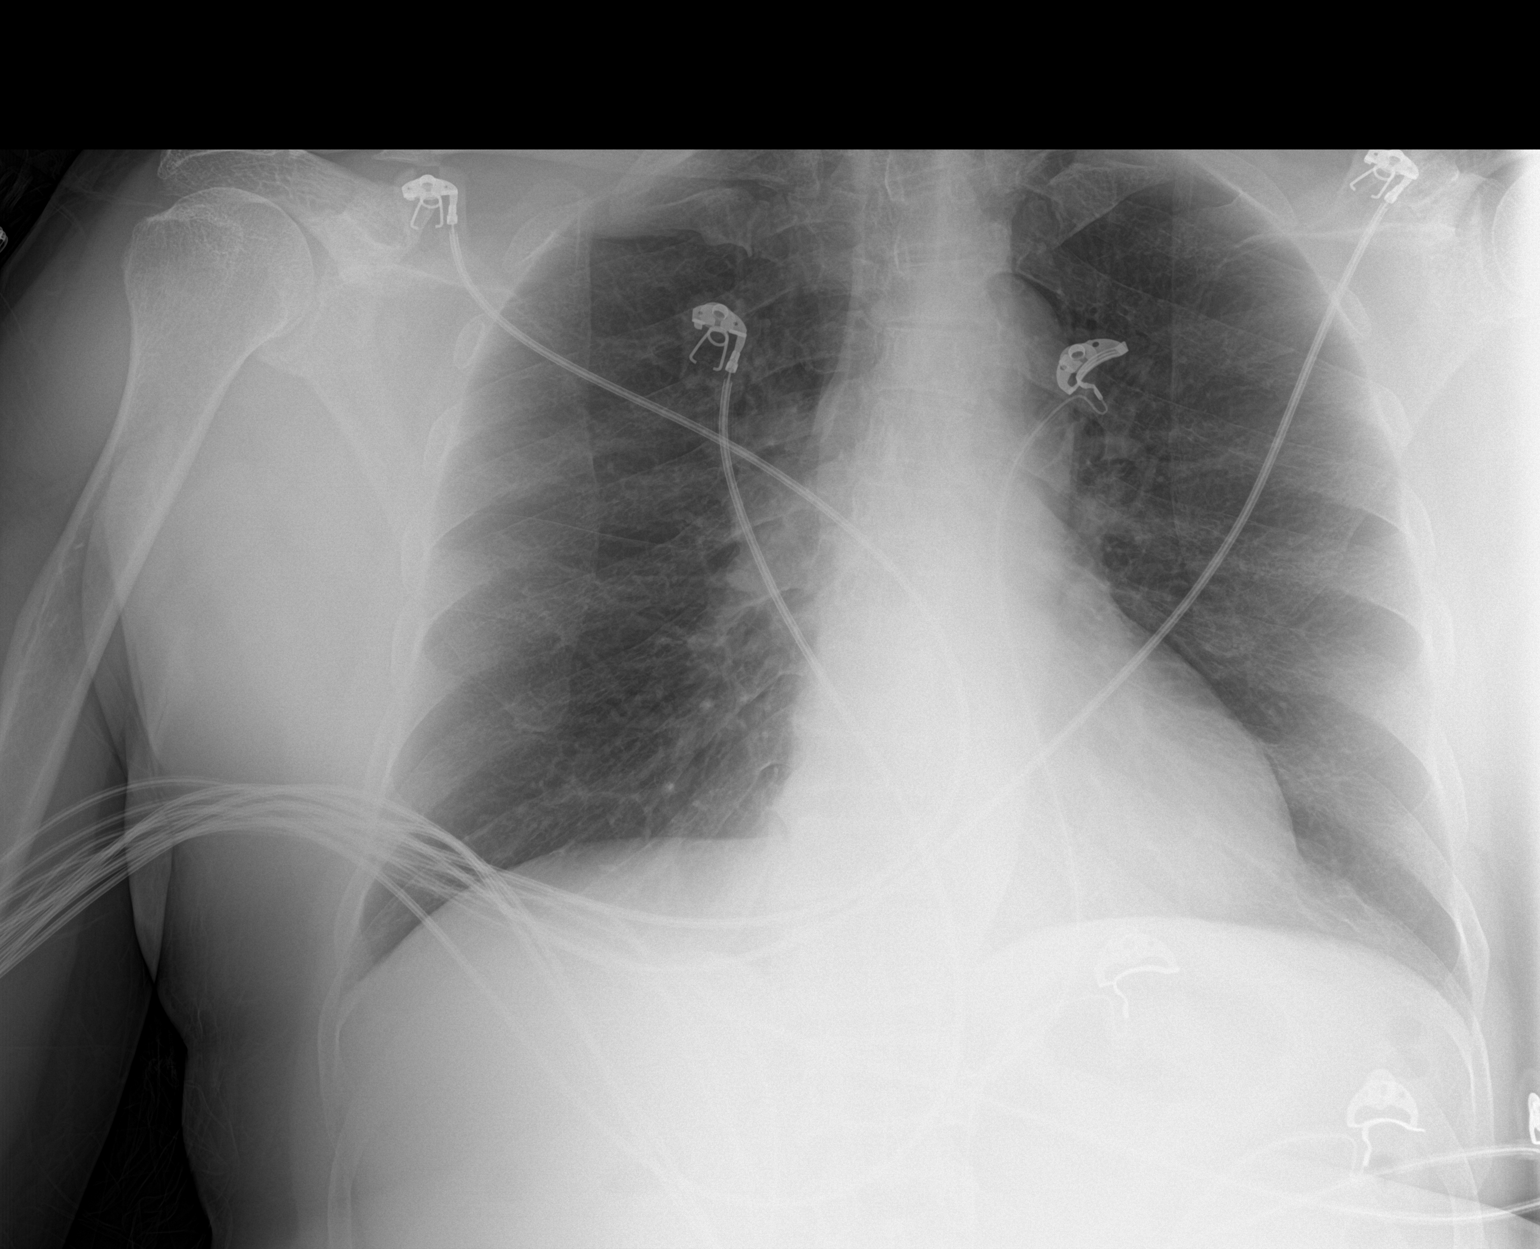

[1 of 1 positions shown; findings below may reference images not displayed]

FINDINGS: The heart size and mediastinal contours are within normal limits.
Both lungs are clear. Thoracolumbar scoliosis.
IMPRESSION: No active disease.

## 2022-12-15 ENCOUNTER — Emergency Department (HOSPITAL_COMMUNITY): Payer: BC Managed Care – PPO

## 2022-12-15 ENCOUNTER — Inpatient Hospital Stay (HOSPITAL_COMMUNITY)
Admission: EM | Admit: 2022-12-15 | Discharge: 2022-12-17 | DRG: 392 | Disposition: A | Discharge status: Home / Self Care | Payer: BC Managed Care – PPO | Attending: Infectious Diseases | Admitting: Infectious Diseases

## 2022-12-15 ENCOUNTER — Other Ambulatory Visit: Payer: Self-pay

## 2022-12-15 ENCOUNTER — Inpatient Hospital Stay (HOSPITAL_COMMUNITY): Payer: BC Managed Care – PPO

## 2022-12-15 DIAGNOSIS — R769 Abnormal immunological finding in serum, unspecified: Secondary | ICD-10-CM

## 2022-12-15 DIAGNOSIS — R000 Tachycardia, unspecified: Secondary | ICD-10-CM | POA: Diagnosis not present

## 2022-12-15 DIAGNOSIS — R109 Unspecified abdominal pain: Secondary | ICD-10-CM | POA: Diagnosis not present

## 2022-12-15 DIAGNOSIS — D649 Anemia, unspecified: Secondary | ICD-10-CM | POA: Insufficient documentation

## 2022-12-15 DIAGNOSIS — R0602 Shortness of breath: Secondary | ICD-10-CM | POA: Diagnosis not present

## 2022-12-15 DIAGNOSIS — K5900 Constipation, unspecified: Secondary | ICD-10-CM | POA: Diagnosis present

## 2022-12-15 DIAGNOSIS — B953 Streptococcus pneumoniae as the cause of diseases classified elsewhere: Secondary | ICD-10-CM | POA: Diagnosis present

## 2022-12-15 DIAGNOSIS — R6511 Systemic inflammatory response syndrome (SIRS) of non-infectious origin with acute organ dysfunction: Secondary | ICD-10-CM

## 2022-12-15 DIAGNOSIS — R6510 Systemic inflammatory response syndrome (SIRS) of non-infectious origin without acute organ dysfunction: Principal | ICD-10-CM | POA: Diagnosis present

## 2022-12-15 DIAGNOSIS — K4090 Unilateral inguinal hernia, without obstruction or gangrene, not specified as recurrent: Secondary | ICD-10-CM | POA: Diagnosis present

## 2022-12-15 DIAGNOSIS — R1033 Periumbilical pain: Principal | ICD-10-CM | POA: Diagnosis present

## 2022-12-15 DIAGNOSIS — Z87898 Personal history of other specified conditions: Secondary | ICD-10-CM | POA: Insufficient documentation

## 2022-12-15 DIAGNOSIS — A419 Sepsis, unspecified organism: Principal | ICD-10-CM

## 2022-12-15 DIAGNOSIS — J9811 Atelectasis: Secondary | ICD-10-CM | POA: Diagnosis not present

## 2022-12-15 DIAGNOSIS — R911 Solitary pulmonary nodule: Secondary | ICD-10-CM | POA: Diagnosis not present

## 2022-12-15 DIAGNOSIS — R1084 Generalized abdominal pain: Secondary | ICD-10-CM | POA: Diagnosis not present

## 2022-12-15 DIAGNOSIS — Z79899 Other long term (current) drug therapy: Secondary | ICD-10-CM | POA: Diagnosis not present

## 2022-12-15 DIAGNOSIS — R509 Fever, unspecified: Secondary | ICD-10-CM | POA: Diagnosis not present

## 2022-12-15 DIAGNOSIS — Z833 Family history of diabetes mellitus: Secondary | ICD-10-CM

## 2022-12-15 DIAGNOSIS — R7881 Bacteremia: Secondary | ICD-10-CM

## 2022-12-15 DIAGNOSIS — I10 Essential (primary) hypertension: Secondary | ICD-10-CM | POA: Diagnosis not present

## 2022-12-15 DIAGNOSIS — Z1152 Encounter for screening for COVID-19: Secondary | ICD-10-CM

## 2022-12-15 DIAGNOSIS — R0789 Other chest pain: Secondary | ICD-10-CM | POA: Diagnosis not present

## 2022-12-15 DIAGNOSIS — R739 Hyperglycemia, unspecified: Secondary | ICD-10-CM | POA: Diagnosis present

## 2022-12-15 DIAGNOSIS — R140 Abdominal distension (gaseous): Secondary | ICD-10-CM | POA: Diagnosis not present

## 2022-12-15 LAB — RESPIRATORY PANEL BY PCR

## 2022-12-15 LAB — BLOOD CULTURE ID PANEL (REFLEXED) - BCID2

## 2022-12-15 LAB — CBC WITH DIFFERENTIAL/PLATELET
Abs Immature Granulocytes: 0.14 10*3/uL — ABNORMAL HIGH (ref 0.00–0.07)
Basophils Absolute: 0 10*3/uL (ref 0.0–0.1)
Basophils Relative: 0 %
Eosinophils Absolute: 0 10*3/uL (ref 0.0–0.5)
Eosinophils Relative: 0 %
HCT: 38 % — ABNORMAL LOW (ref 39.0–52.0)
Hemoglobin: 12.9 g/dL — ABNORMAL LOW (ref 13.0–17.0)
Immature Granulocytes: 1 %
Lymphocytes Relative: 5 %
Lymphs Abs: 0.7 10*3/uL (ref 0.7–4.0)
MCH: 30.9 pg (ref 26.0–34.0)
MCHC: 33.9 g/dL (ref 30.0–36.0)
MCV: 90.9 fL (ref 80.0–100.0)
Monocytes Absolute: 0.6 10*3/uL (ref 0.1–1.0)
Monocytes Relative: 5 %
Neutro Abs: 11.7 10*3/uL — ABNORMAL HIGH (ref 1.7–7.7)
Neutrophils Relative %: 89 %
Platelets: 190 10*3/uL (ref 150–400)
RBC: 4.18 MIL/uL — ABNORMAL LOW (ref 4.22–5.81)
RDW: 13.2 % (ref 11.5–15.5)
WBC: 13.1 10*3/uL — ABNORMAL HIGH (ref 4.0–10.5)
nRBC: 0 % (ref 0.0–0.2)

## 2022-12-15 LAB — URINALYSIS, W/ REFLEX TO CULTURE (INFECTION SUSPECTED)
Bacteria, UA: NONE SEEN
Bilirubin Urine: NEGATIVE
Glucose, UA: NEGATIVE mg/dL
Ketones, ur: NEGATIVE mg/dL
Leukocytes,Ua: NEGATIVE
Nitrite: NEGATIVE
Protein, ur: NEGATIVE mg/dL
Specific Gravity, Urine: 1.02 (ref 1.005–1.030)
pH: 5 (ref 5.0–8.0)

## 2022-12-15 LAB — COMPREHENSIVE METABOLIC PANEL
ALT: 25 U/L (ref 0–44)
AST: 28 U/L (ref 15–41)
Albumin: 3.9 g/dL (ref 3.5–5.0)
Alkaline Phosphatase: 65 U/L (ref 38–126)
Anion gap: 11 (ref 5–15)
BUN: 8 mg/dL (ref 8–23)
CO2: 23 mmol/L (ref 22–32)
Calcium: 9.3 mg/dL (ref 8.9–10.3)
Chloride: 100 mmol/L (ref 98–111)
Creatinine, Ser: 1.08 mg/dL (ref 0.61–1.24)
GFR, Estimated: >60 mL/min (ref >60)
Glucose, Bld: 153 mg/dL — ABNORMAL HIGH (ref 70–99)
Potassium: 4.3 mmol/L (ref 3.5–5.1)
Sodium: 134 mmol/L — ABNORMAL LOW (ref 135–145)
Total Bilirubin: 1.7 mg/dL — ABNORMAL HIGH (ref 0.3–1.2)
Total Protein: 7.5 g/dL (ref 6.5–8.1)

## 2022-12-15 LAB — ECHOCARDIOGRAM COMPLETE
AR max vel: 1.53 cm2
AV Area VTI: 1.33 cm2
AV Area mean vel: 1.42 cm2
AV Mean grad: 5 mmHg
AV Peak grad: 8.5 mmHg
Ao pk vel: 1.46 m/s
Area-P 1/2: 4.65 cm2
Height: 67 in
S' Lateral: 1.7 cm
Weight: 3121.71 oz

## 2022-12-15 LAB — CULTURE, BLOOD (ROUTINE X 2): Special Requests: ADEQUATE

## 2022-12-15 LAB — LACTIC ACID, PLASMA
Lactic Acid, Venous: 1.1 mmol/L (ref 0.5–1.9)
Lactic Acid, Venous: 1.1 mmol/L (ref 0.5–1.9)

## 2022-12-15 LAB — SARS CORONAVIRUS 2 BY RT PCR: SARS Coronavirus 2 by RT PCR: NEGATIVE

## 2022-12-15 LAB — TROPONIN I (HIGH SENSITIVITY)
Troponin I (High Sensitivity): 7 ng/L (ref <18)
Troponin I (High Sensitivity): 8 ng/L (ref <18)

## 2022-12-15 LAB — TSH: TSH: 1.008 u[IU]/mL (ref 0.350–4.500)

## 2022-12-15 LAB — T4, FREE: Free T4: 0.78 ng/dL (ref 0.61–1.12)

## 2022-12-15 LAB — PROTIME-INR
INR: 1.1 (ref 0.8–1.2)
Prothrombin Time: 14.4 seconds (ref 11.4–15.2)

## 2022-12-15 LAB — RESP PANEL BY RT-PCR (RSV, FLU A&B, COVID)  RVPGX2
Influenza A by PCR: NEGATIVE
Influenza B by PCR: NEGATIVE
Resp Syncytial Virus by PCR: NEGATIVE
SARS Coronavirus 2 by RT PCR: NEGATIVE

## 2022-12-15 LAB — BRAIN NATRIURETIC PEPTIDE: B Natriuretic Peptide: 18.1 pg/mL (ref 0.0–100.0)

## 2022-12-15 LAB — APTT: aPTT: 28 seconds (ref 24–36)

## 2022-12-15 LAB — HIV ANTIBODY (ROUTINE TESTING W REFLEX): HIV Screen 4th Generation wRfx: NONREACTIVE

## 2022-12-15 MED ORDER — VANCOMYCIN HCL 1500 MG/300ML IV SOLN
1500.0000 mg | Freq: Once | INTRAVENOUS | Status: AC
  Administered 2022-12-15: 1500 mg via INTRAVENOUS
  Filled 2022-12-15: qty 300

## 2022-12-15 MED ORDER — LACTATED RINGERS IV BOLUS (SEPSIS)
1000.0000 mL | Freq: Once | INTRAVENOUS | Status: AC
  Administered 2022-12-15: 1000 mL via INTRAVENOUS

## 2022-12-15 MED ORDER — METRONIDAZOLE 500 MG/100ML IV SOLN
500.0000 mg | Freq: Once | INTRAVENOUS | Status: AC
  Administered 2022-12-15: 500 mg via INTRAVENOUS
  Filled 2022-12-15: qty 100

## 2022-12-15 MED ORDER — SODIUM CHLORIDE 0.9 % IV SOLN
2.0000 g | INTRAVENOUS | Status: DC
  Administered 2022-12-15 – 2022-12-17 (×3): 2 g via INTRAVENOUS
  Filled 2022-12-15 (×4): qty 20

## 2022-12-15 MED ORDER — IOHEXOL 350 MG/ML SOLN
75.0000 mL | Freq: Once | INTRAVENOUS | Status: AC | PRN
  Administered 2022-12-15: 75 mL via INTRAVENOUS

## 2022-12-15 MED ORDER — VANCOMYCIN HCL IN DEXTROSE 1-5 GM/200ML-% IV SOLN: 1000.0000 mg | Freq: Once | INTRAVENOUS | Status: DC

## 2022-12-15 MED ORDER — ACETAMINOPHEN 500 MG PO TABS
1000.0000 mg | ORAL_TABLET | Freq: Once | ORAL | Status: AC
  Administered 2022-12-15: 1000 mg via ORAL
  Filled 2022-12-15: qty 2

## 2022-12-15 MED ORDER — MORPHINE SULFATE (PF) 4 MG/ML IV SOLN
4.0000 mg | Freq: Once | INTRAVENOUS | Status: AC
  Administered 2022-12-15: 4 mg via INTRAVENOUS
  Filled 2022-12-15: qty 1

## 2022-12-15 MED ORDER — ACETAMINOPHEN 500 MG PO TABS
1000.0000 mg | ORAL_TABLET | Freq: Four times a day (QID) | ORAL | Status: DC | PRN
  Administered 2022-12-15 – 2022-12-16 (×4): 1000 mg via ORAL
  Filled 2022-12-15 (×5): qty 2

## 2022-12-15 MED ORDER — IPRATROPIUM BROMIDE 0.02 % IN SOLN: 0.5000 mg | RESPIRATORY_TRACT | Status: DC

## 2022-12-15 MED ORDER — SODIUM CHLORIDE 0.9 % IV SOLN
2.0000 g | Freq: Once | INTRAVENOUS | Status: AC
  Administered 2022-12-15: 2 g via INTRAVENOUS
  Filled 2022-12-15: qty 12.5

## 2022-12-15 MED ORDER — VANCOMYCIN HCL 1250 MG/250ML IV SOLN
1250.0000 mg | INTRAVENOUS | Status: DC
  Filled 2022-12-15: qty 250

## 2022-12-15 MED ORDER — LACTATED RINGERS IV SOLN: INTRAVENOUS | Status: DC

## 2022-12-15 MED ORDER — ORAL CARE MOUTH RINSE: 15.0000 mL | OROMUCOSAL | Status: DC | PRN

## 2022-12-15 MED ORDER — IPRATROPIUM-ALBUTEROL 0.5-2.5 (3) MG/3ML IN SOLN
3.0000 mL | Freq: Once | RESPIRATORY_TRACT | Status: AC
  Administered 2022-12-15: 3 mL via RESPIRATORY_TRACT
  Filled 2022-12-15: qty 3

## 2022-12-15 MED ORDER — ENOXAPARIN SODIUM 40 MG/0.4ML IJ SOSY
40.0000 mg | PREFILLED_SYRINGE | INTRAMUSCULAR | Status: DC
  Administered 2022-12-16 – 2022-12-17 (×2): 40 mg via SUBCUTANEOUS
  Filled 2022-12-15 (×2): qty 0.4

## 2022-12-15 MED ORDER — LACTATED RINGERS IV BOLUS (SEPSIS): 1000.0000 mL | Freq: Once | INTRAVENOUS | Status: DC

## 2022-12-15 MED ORDER — SODIUM CHLORIDE 0.9 % IV SOLN
2.0000 g | Freq: Three times a day (TID) | INTRAVENOUS | Status: DC
  Administered 2022-12-15: 2 g via INTRAVENOUS
  Filled 2022-12-15: qty 12.5

## 2022-12-15 NOTE — ED Provider Notes (Signed)
62 yo male presenting with acute dyspnea on exertion, n/v, pleuritic chest pain, also with abdominal pain  Febrile on arrival, tachycardia - pending infection/sepsis workup WBC 13.1.  Physical Exam  BP (!) 149/73   Pulse (!) 101   Temp 99.9 F (37.7 C) (Oral)   Resp 17   Ht 5\' 7"  (1.702 m)   Wt 88.5 kg   SpO2 97%   BMI 30.56 kg/m   Physical Exam  Procedures  Procedures  ED Course / MDM   Clinical Course as of 12/15/22 0949  Wed Dec 15, 2022  0915 Patient reassessed as remains tachypnea, tachycardic, appears to have increased work of breathing.  On my assessment I do hear some mild expiratory wheezing but there appear to be some rhonchi as well in the lungs.  I wonder about volume overload, although he has no prior history congestive heart failure with normal BNP and no evident pulmonary edema on CT PE.  I have ordered albuterol, as well as a small dose of diuretic.  The patient is also complaining of continued pain in his abdomen, predominantly along the right flank.  Denies any clear cause of this on CT imaging.  The gallbladder was unremarkable and his LFTs are also unremarkable.  There is a small amount of blood in his urine, and it is possible that there may be a small ureteral stone that is not visualized on CT imaging [MT]  0916 At this point the patient still continues to have multiple SIRS criteria, and therefore he will be admitted on broad-spectrum antibiotics for potential sepsis workup.  His lactate was within normal limits.  No evidence of septic shock at this time.  We have slowed his fluids to 150 cc/hour maintenance rates [MT]  0923 Nursing reports pt received 2.1 liters in total for fluid bolus prior to transition to 150 cc/hour infusion. [MT]  [phone] Admitted to IM service.  Repeat lactate &troponin level wnl. [MT]    Clinical Course User Index [MT] Cayle Thunder, Kermit Balo, MD   Medical Decision Making Amount and/or Complexity of Data Reviewed Labs: ordered. Radiology:  ordered. ECG/medicine tests: ordered.  Risk OTC drugs. Prescription drug management. Decision regarding hospitalization.         Terald Sleeper, MD 12/15/22 [phone]

## 2022-12-15 NOTE — Progress Notes (Signed)
   12/15/22 1500  Assess: MEWS Score  Temp (!) 100.9 F (38.3 C)  BP 118/74  MAP (mmHg) 85  Pulse Rate (!) 132  ECG Heart Rate (!) 132  Resp (!) 28  SpO2 96 %  O2 Device Nasal Cannula  O2 Flow Rate (L/min) 2 L/min  Assess: MEWS Score  MEWS Temp 1  MEWS Systolic 0  MEWS Pulse 3  MEWS RR 2  MEWS LOC 0  MEWS Score 6  MEWS Score Color Red  Assess: if the MEWS score is Yellow or Red  Were vital signs taken at a resting state? Yes  Focused Assessment No change from prior assessment  Does the patient meet 2 or more of the SIRS criteria? Yes  Does the patient have a confirmed or suspected source of infection? Yes  MEWS guidelines implemented  Yes, red  Treat  MEWS Interventions Considered administering scheduled or prn medications/treatments as ordered  Take Vital Signs  Increase Vital Sign Frequency  Red: Q1hr x2, continue Q4hrs until patient remains green for 12hrs  Escalate  MEWS: Escalate Red: Discuss with charge nurse and notify provider. Consider notifying RRT. If remains red for 2 hours consider need for higher level of care  Notify: Charge Nurse/RN  Name of Charge Nurse/RN Notified Yoko RN  Provider Notification  Provider Name/Title Dr. Aundria Rud, IMTS  Date Provider Notified 12/15/22  Time Provider Notified 1445  Method of Notification Page  Notification Reason Other (Comment) (new red MEWS noted before patient came up from ED)  Provider response Other (Comment) (Tylenol ordered)  Date of Provider Response 12/15/22  Time of Provider Response 1450  Assess: SIRS CRITERIA  SIRS Temperature  0  SIRS Pulse 1  SIRS Respirations  1  SIRS WBC 1  SIRS Score Sum  3

## 2022-12-15 NOTE — Progress Notes (Addendum)
Pharmacy Antibiotic Note  Jake Fisher is a 62 y.o. male admitted on 12/15/2022 with sepsis.  Pharmacy has been consulted for Cefepime and Vancomycin dosing.  WBC 13.1, Tmax 102.3, LA 1.1, HR 117, RR 17 SCr 1.08  Plan: Start Cefepime 2g IV q8h  Initiate loading dose of Vancomycin 1500mg  IV x 1, followed by  Vancomycin 1250mg  IV q24h (eAUC ~475)    > Goal AUC 400-550    > Check vancomycin levels at steady state  Continue Metronidazole 500mg  IV q12h per MD Monitor daily CBC, temp, SCr, and for clinical signs of improvement  F/u cultures and de-escalate antibiotics as able   Height: 5\' 7"  (170.2 cm) Weight: 88.5 kg (195 lb 1.7 oz) IBW/kg (Calculated) : 66.1  Temp (24hrs), Avg:101.1 F (38.4 C), Min:99.9 F (37.7 C), Max:102.3 F (39.1 C)  Recent Labs  Lab 12/15/22 0625 12/15/22 0626  WBC 13.1*  --   CREATININE 1.08  --   LATICACIDVEN  --  1.1    Estimated Creatinine Clearance: 75.3 mL/min (by C-G formula based on SCr of 1.08 mg/dL).    No Known Allergies  Antimicrobials this admission: Cefepime 5/8 >>  Vancomycin 5/8 >>  Metronidazole 5/8 >>   Dose adjustments this admission: N/A  Microbiology results: 5/8 BCx: sent   Thank you for allowing pharmacy to be a part of this patient's care.  Wilburn Cornelia, PharmD, BCPS Clinical Pharmacist 12/15/2022 8:21 AM   Please refer to AMION for pharmacy phone number

## 2022-12-15 NOTE — H&P (Signed)
NAME:  Jake Fisher, MRN:  [id number], DOB:  1960/09/16, LOS: 0 ADMISSION DATE:  12/15/2022, Primary: Pcp, No  CHIEF COMPLAINT:  abdominal pain   Medical Service: Internal Medicine Teaching Service         Attending Physician: Dr. Ginnie Smart, MD    First Contact: Dr. Aundria Rud Pager: [phone]  Second Contact: Dr. Sloan Leiter Pager: [phone]       After Hours (After 5p/  First Contact Pager: [phone]  weekends / holidays): Second Contact Pager: [phone]   HISTORY OF PRESENT ILLNESS   Jake Fisher is 62yo person living with hypertension presenting to [facility] abdominal pain for the last day. Jake Fisher is accompanied today by his niece, Osborne Casco, who helps supplement history. Patient reports he was asymptomatic and at his usual baseline until yesterday when he began having sharp right flank pain and subjective fever. He denies any accompanying symptoms, including nausea, vomiting, diarrhea. He does mention he is intermittently constipated, but this has not changed recently. He also mentions a chronic cough that has not changed recently. States he is chronically short of breath on exertion and often needs multiple pillows to sleep at night due to orthopnea, but again this has not changed as of recent. He denies any lower extremity edema or PND, although patient's niece reports he does occasionally have apneic episodes when sleeping. He notes he currently cannot see as well as usual, although he does not have his glasses currently. Denies diplopia, peripheral vision loss, mental status changes, focal weakness or sensation changes, chest pains, palpitations, dysuria, polyuria, rashes. He wears dentures and has a tooth that he was told needs to be pulled, however has not had any new recent changes with this.  States he usually is not wheezing, but has developed some wheezing since being in the Emergency Department.   PCP: Pcp, No  ED COURSE   Patient arrived to [facility] febrile to 102.7F, tachycardic to 120's,  normotensive, tachypneic, sating well on room air. Initial lab work with leukocytosis, normal lactate. Imaging revealed large non-obstructed left inguinal hernia, no pulmonary embolism, and bibasilar atelectasis. IMTS subsequently called for admission.   PAST MEDICAL HISTORY   He,  has no past medical history on file.   HOME MEDICATIONS   Prior to Admission medications   Medication Sig Start Date End Date Taking? Authorizing Provider  benzonatate (TESSALON) 100 MG capsule Take 1 capsule (100 mg total) by mouth every 8 (eight) hours. 01/05/21   Renne Crigler, PA-C  COD LIVER OIL PO Take 2-3 capsules by mouth daily.    [provider]  Ferrous Sulfate (FERRA PO) Take 5 mLs by mouth daily after supper.    [provider]  losartan (COZAAR) 50 MG tablet Take 50 mg by mouth daily. 07/26/22   [provider]  polyethylene glycol powder (MIRALAX) powder Take 17 g by mouth daily. 07/05/17   Lavera Guise, MD  rosuvastatin (CRESTOR) 20 MG tablet Take 20 mg by mouth daily. 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 He denies any tobacco, alcohol, or drug use.   FAMILY HISTORY   His family history includes Diabetes in his mother.   REVIEW OF SYSTEMS   ROS per history of present illness.  PHYSICAL EXAMINATION   Blood pressure (!) 180/85, pulse (!) 107, temperature 99.7 F (  37.6 C), temperature source Oral, resp. rate (!) 24, height 5\' 7"  (1.702 m), weight 88.5 kg, SpO2 97 %.    Filed Weights   12/15/22 0617  Weight: 88.5 kg   GENERAL: Middle aged appearing person laying in bed in mild distress HENT: Normocephalic, atraumatic. No nuchal rigidity. One molar tooth in bottom right of mouth without purulence, no abscess or mass appreciated. Moist mucous membranes.  EYES: Vision grossly in tact. Pupils equal, round, reactive  bilaterally. No scleral icterus or conjunctival injection.  CV: Tachycardic, regular rhythm. No murmurs appreciated. Dorsalis pedis pulses 2+ bilaterally. Bilateral radial pulses 2+. PULM: Increased respiratory rate with use of accessory muscles on room air. Mild inspiratory wheezing in bilateral bases. No rales appreciated.  GI: Abdomen soft, mildly distended. Mild tenderness on right flank. No fluid wave appreciated. Normoactive bowel sounds.  GU: Left inguinal hernia appreciated, reducible, worsening with cough. Area does not appear discolored or erythematous. No pain on palpation.  MSK: Normal bulk, tone. No peripheral edema noted.  SKIN: Warm, dry. No rashes or lesions noted throughout.  NEURO: Awake, alert, conversing appropriately. Cranial nerves in tact. Motor 5/5 throughout. Sensation in tact throughout. Finger to nose bilaterally normal.  PSYCH: Normal mood, affect.   SIGNIFICANT DIAGNOSTIC TESTS   ECG: Sinus tachycardia. No ST changes or alterans appreciated.   I personally reviewed patient's ECG with my interpretation as above.  CXR: No consolidations, vascular congestion, effusions. Bibasilar atelectasis. Mild scoliosis.   I personally reviewed patient's CXR with my interpretation as above.  CT ABDOMEN PELVIS W CONTRAST Large left inguinal hernia which contains a loop of sigmoid colon, but does not result in obstruction. Mild prostatic enlargement.  CTA CHEST W WO CONTRAST No definite evidence of pulmonary embolus. Mild bibasilar subsegmental atelectasis. 3 mm subpleural nodule seen in left upper lobe. Although likely benign, if the patient is high-risk, given the morphology and/or location of this nodule a non-contrast chest CT can be considered in 12 months.  LABS      Latest Ref Rng & Units 12/15/2022    6:25 AM 03/06/2021   12:37 PM 07/05/2017   12:15 PM  CBC  WBC 4.0 - 10.5 K/uL 13.1  5.2  4.5   Hemoglobin 13.0 - 17.0 g/dL 40.9  81.1  91.4   Hematocrit 39.0 - 52.0 %  38.0  37.4  39.0   Platelets 150 - 400 K/uL 190  233  213       Latest Ref Rng & Units 12/15/2022    6:25 AM 03/06/2021   12:37 PM 01/05/2021   11:25 AM  BMP  Glucose 70 - 99 mg/dL 782  93  956   BUN 8 - 23 mg/dL 8  17  12    Creatinine 0.61 - 1.24 mg/dL 2.13  0.86  5.78   Sodium 135 - 145 mmol/L 134  140  136   Potassium 3.5 - 5.1 mmol/L 4.3  4.1  3.1   Chloride 98 - 111 mmol/L 100  106  101   CO2 22 - 32 mmol/L 23  28  29    Calcium 8.9 - 10.3 mg/dL 9.3  9.3  8.7     CONSULTS   N/a  ASSESSMENT   Damorian Rozzi is 62yo person living with hypertension admitted 5/8 with concern for systemic inflammatory response without definitive source of infection.   PLAN   Principal Problem:   Severe systemic inflammatory response syndrome (SIRS) (HCC)  #Febrile illness with sinus tachycardia Patient is presenting without  a clear etiology of his current illness. It is concerning that he is significantly tachycardic to 130-140's while febrile to 102F. Thankfully, his blood pressure appears to be normo/hypertensive and he is not hypoxic. He seems to be perfusing well on exam and his lactate is normal, does not appear to be in a shock state. On exam, he is using his accessory muscles and is in mild respiratory distress. Imaging thus far has been unrevealing, without any evidence of a PE, pneumothorax, or consolidation concerning for pneumonia.. I did obtain a STAT Echo to evaluate for any tamponade or other abnormalities that could be causing his profound tachycardia. Troponins negative, no regoinal wall abnormalities on Echo, no ischemic changes on ECG.  Echo did show some RV volume overload but normal biventricular function and no effusion. He does have some mild R flank abdominal pain, however his CT abdomen did not show anything to correlate with this. His renal and liver function appear to be normal. He does have a large left inguinal hernia, however it is reducible and he is not having any symptoms  related to this. He denies any drug or alcohol use and his only medication is losartan. He is not showing any meningeal signs and is not encephalopathic, I do not think a LP or head imaging would be of high value at this point. I did check thyroid studies that are normal as well. He does not appear grossly hypervolemic on exam, however given is Echo findings we will hold further fluids. We will continue with broad spectrum antibiotics and give him nebulizer treatments to help with his respiratory symptoms.  - Continue broad-spectrum antibiotics - Follow-up blood cultures - Hold further fluids for now - Follow-up viral pathogen panel - Ipatropium q4h for wheezing (avoiding albuterol with sinus tachycardia)  #Chronic normocytic anemia Once patient is over acute illness would repeat CBC and obtain iron studies. - Daily CBC  #Hyperglycemia No history of diabetes, will obtain A1c. - Follow-up A1c  BEST PRACTICE   DIET: Regular IVF: na DVT PPX: lovenox BOWEL: na CODE: na FAM COM: discussed with niece at bedside  DISPO: Admit patient to Inpatient with expected length of stay greater than 2 midnights.  Evlyn Kanner, MD Internal Medicine Resident PGY-3 Pager [phone] 12/15/22 11:17 AM

## 2022-12-15 NOTE — ED Notes (Signed)
MD Trifan notified about patient wheezing and labored breathing.  MD Trifan to bedside with this RN.

## 2022-12-15 NOTE — ED Triage Notes (Signed)
Chief Complaint  Patient presents with   Shortness of Breath   BP 138/83 (BP Location: Left Arm)   Pulse (!) 128   Temp (!) 102.3 F (39.1 C) (Oral)   Resp (!) 26   SpO2 99%   Pt presents to ED 35 via EMS from home with above complaint.  Pt reports SOB that gets worse with movement.  States he was trying to go to bus stop and could not make it.  Also states he notices increased SOB with climbing stairs.  Endorses substernal chest pressure that gets worse with breathing in/out.  Pt also endorsing abdominal discomfort for 2 days and ankle swelling.

## 2022-12-15 NOTE — ED Notes (Signed)
Report called to floor RN

## 2022-12-15 NOTE — Sepsis Progress Note (Signed)
Elink monitoring for the code sepsis protocol.  

## 2022-12-15 NOTE — ED Notes (Signed)
Echo at bedside

## 2022-12-15 NOTE — Progress Notes (Signed)
Patient transferred from ED at 1502hrs. Oriented to unit and plan of care for shift.  Patient verbalized understanding.  Sister at bedside.

## 2022-12-15 NOTE — ED Notes (Signed)
Patient transported to CT 

## 2022-12-15 NOTE — ED Provider Notes (Signed)
[place] EMERGENCY DEPARTMENT AT [facility] Provider Note   CSN: [id number] Arrival date & time: 12/15/22  4098     History  Chief Complaint  Patient presents with   Shortness of Breath    Jake Fisher is a 62 y.o. male.  Patient presents to the ED for severe abdominal pain and DOE. He states he started having periumbilical abdominal pain last night. No nausea, vomiting, dysuria or diarrhea. States he had some back pain as well. Thought it would go away but when walking to the bus the pain was severe in nature and he also became significantly dyspneic. Came in for further eval. States he has some 'dryness' in his throat. No cough. No fevers or chills at home. States he had a    Shortness of Breath      Home Medications Prior to Admission medications   Medication Sig Start Date End Date Taking? Authorizing Provider  benzonatate (TESSALON) 100 MG capsule Take 1 capsule (100 mg total) by mouth every 8 (eight) hours. 01/05/21   Renne Crigler, PA-C  COD LIVER OIL PO Take 2-3 capsules by mouth daily.    [provider]  Ferrous Sulfate (FERRA PO) Take 5 mLs by mouth daily after supper.    [provider]  polyethylene glycol powder (MIRALAX) powder Take 17 g by mouth daily. 07/05/17   Lavera Guise, MD      Allergies    Patient has no known allergies.    Review of Systems   Review of Systems  Respiratory:  Positive for shortness of breath.     Physical Exam Updated Vital Signs BP 138/83 (BP Location: Left Arm)   Pulse (!) 128   Temp (!) 102.3 F (39.1 C) (Oral)   Resp (!) 26   Ht 5\' 7"  (1.702 m)   Wt 88.5 kg   SpO2 99%   BMI 30.56 kg/m  Physical Exam Vitals and nursing note reviewed.  Constitutional:      Appearance: He is well-developed.  HENT:     Head: Normocephalic and atraumatic.  Cardiovascular:     Rate and Rhythm: Normal rate.  Pulmonary:     Effort: Pulmonary effort is normal. No respiratory distress.     Breath sounds:  No decreased breath sounds or wheezing.  Chest:     Chest wall: No tenderness.  Abdominal:     General: There is no distension.     Tenderness: There is abdominal tenderness (RLQ).  Musculoskeletal:        General: Normal range of motion.     Cervical back: Normal range of motion.     Right lower leg: No edema.     Left lower leg: No edema.  Neurological:     Mental Status: He is alert.     ED Results / Procedures / Treatments   Labs (all labs ordered are listed, but only abnormal results are displayed) Labs Reviewed  RESP PANEL BY RT-PCR (RSV, FLU A&B, COVID)  [id number]  CULTURE, BLOOD (ROUTINE X 2)  CULTURE, BLOOD (ROUTINE X 2)  LACTIC ACID, PLASMA  LACTIC ACID, PLASMA  COMPREHENSIVE METABOLIC PANEL  CBC WITH DIFFERENTIAL/PLATELET  PROTIME-INR  APTT  URINALYSIS, W/ REFLEX TO CULTURE (INFECTION SUSPECTED)  BRAIN NATRIURETIC PEPTIDE  TROPONIN I (HIGH SENSITIVITY)    EKG None  Radiology No results found.  Procedures .Critical Care  Performed by: Marily Memos, MD Authorized by: Marily Memos, MD   Critical care provider statement:    Critical  care time (minutes):  30   Critical care was necessary to treat or prevent imminent or life-threatening deterioration of the following conditions:  Respiratory failure and sepsis   Critical care was time spent personally by me on the following activities:  Development of treatment plan with patient or surrogate, discussions with consultants, evaluation of patient's response to treatment, examination of patient, ordering and review of laboratory studies, ordering and review of radiographic studies, ordering and performing treatments and interventions, pulse oximetry, re-evaluation of patient's condition and review of old charts     Medications Ordered in ED Medications  lactated ringers infusion (has no administration in time range)  acetaminophen (TYLENOL) tablet 1,000 mg (has no administration in time range)  lactated ringers  bolus 1,000 mL (has no administration in time range)    And  lactated ringers bolus 1,000 mL (has no administration in time range)    And  lactated ringers bolus 1,000 mL (has no administration in time range)  ceFEPIme (MAXIPIME) 2 g in sodium chloride 0.9 % 100 mL IVPB (has no administration in time range)  metroNIDAZOLE (FLAGYL) IVPB 500 mg (has no administration in time range)  vancomycin (VANCOREADY) IVPB 1500 mg/300 mL (has no administration in time range)    ED Course/ Medical Decision Making/ A&P Clinical Course as of 12/17/22 0404  Wed Dec 15, 2022  0915 Patient reassessed as remains tachypnea, tachycardic, appears to have increased work of breathing.  On my assessment I do hear some mild expiratory wheezing but there appear to be some rhonchi as well in the lungs.  I wonder about volume overload, although he has no prior history congestive heart failure with normal BNP and no evident pulmonary edema on CT PE.  I have ordered albuterol, as well as a small dose of diuretic.  The patient is also complaining of continued pain in his abdomen, predominantly along the right flank.  Denies any clear cause of this on CT imaging.  The gallbladder was unremarkable and his LFTs are also unremarkable.  There is a small amount of blood in his urine, and it is possible that there may be a small ureteral stone that is not visualized on CT imaging [MT]  0916 At this point the patient still continues to have multiple SIRS criteria, and therefore he will be admitted on broad-spectrum antibiotics for potential sepsis workup.  His lactate was within normal limits.  No evidence of septic shock at this time.  We have slowed his fluids to 150 cc/hour maintenance rates [MT]  0923 Nursing reports pt received 2.1 liters in total for fluid bolus prior to transition to 150 cc/hour infusion. [MT]  [phone] Admitted to IM service.  Repeat lactate &troponin level wnl. [MT]    Clinical Course User Index [MT] Trifan, Kermit Balo, MD                             Medical Decision Making Amount and/or Complexity of Data Reviewed Labs: ordered. Radiology: ordered. ECG/medicine tests: ordered.  Risk OTC drugs. Prescription drug management. Decision regarding hospitalization.   Code sepsis initiated, broad spectrum antibiotics started. Dyspnea and chest pain - PE rule out. Abdominal pain is the biggest issue, associated with back pain - gall bladder vs appy vs gas, will get ct for same. Tylenol for fever/pain.    Final Clinical Impression(s) / ED Diagnoses Final diagnoses:  None    Rx / DC Orders ED Discharge Orders  None         Ciel Chervenak, Barbara Cower, MD 12/17/22 [phone]

## 2022-12-15 NOTE — ED Notes (Signed)
ED TO INPATIENT HANDOFF REPORT  ED Nurse Name and Phone #: [phone]  S Name/Age/Gender Jake Fisher 62 y.o. male Room/Bed: 040C/040C  Code Status   Code Status: Full Code  Home/SNF/Other Home Patient oriented to: self Is this baseline? Yes   Triage Complete: Triage complete  Chief Complaint Severe systemic inflammatory response syndrome (SIRS) (HCC) [R65.10]  Triage Note Chief Complaint  Patient presents with   Shortness of Breath   BP 138/83 (BP Location: Left Arm)   Pulse (!) 128   Temp (!) 102.3 F (39.1 C) (Oral)   Resp (!) 26   SpO2 99%   Pt presents to ED 35 via EMS from home with above complaint.  Pt reports SOB that gets worse with movement.  States he was trying to go to bus stop and could not make it.  Also states he notices increased SOB with climbing stairs.  Endorses substernal chest pressure that gets worse with breathing in/out.  Pt also endorsing abdominal discomfort for 2 days and ankle swelling.   Allergies No Known Allergies  Level of Care/Admitting Diagnosis ED Disposition     ED Disposition  Admit   Condition  --   Comment  Hospital Area: [facility] [[id number]]  Level of Care: Progressive [102]  Admit to Progressive based on following criteria: CARDIOVASCULAR & THORACIC of moderate stability with acute coronary syndrome symptoms/low risk myocardial infarction/hypertensive urgency/arrhythmias/heart failure potentially compromising stability and stable post cardiovascular intervention patients.  May admit patient to [facility] or [facility] if equivalent level of care is available:: No  Covid Evaluation: Asymptomatic - no recent exposure (last 10 days) testing not required  Diagnosis: Severe systemic inflammatory response syndrome (SIRS) (HCC) [[id number]]  Admitting Physician: Ginnie Smart [[id number]]  Attending Physician: Ninetta Lights, JEFFREY C [[id number]]  Certification:: I certify this patient will need inpatient services for at least  2 midnights  Estimated Length of Stay: 2          B Medical/Surgery History No past medical history on file. No past surgical history on file.   A IV Location/Drains/Wounds Patient Lines/Drains/Airways Status     Active Line/Drains/Airways     Name Placement date Placement time Site Days   Peripheral IV 12/15/22 20 G Right Antecubital 12/15/22  0634  Antecubital  less than 1   Peripheral IV 12/15/22 20 G Left Antecubital 12/15/22  0645  Antecubital  less than 1            Intake/Output Last 24 hours  Intake/Output Summary (Last 24 hours) at 12/15/2022 1418 Last data filed at 12/15/2022 1340 Gross per 24 hour  Intake 707.54 ml  Output --  Net 707.54 ml    Labs/Imaging Results for orders placed or performed during the hospital encounter of 12/15/22 (from the past 48 hour(s))  Comprehensive metabolic panel     Status: Abnormal   Collection Time: 12/15/22  6:25 AM  Result Value Ref Range   Sodium 134 (L) 135 - 145 mmol/L   Potassium 4.3 3.5 - 5.1 mmol/L    Comment: HEMOLYSIS AT THIS LEVEL MAY AFFECT RESULT   Chloride 100 98 - 111 mmol/L   CO2 23 22 - 32 mmol/L   Glucose, Bld 153 (H) 70 - 99 mg/dL    Comment: Glucose reference range applies only to samples taken after fasting for at least 8 hours.   BUN 8 8 - 23 mg/dL   Creatinine, Ser 1.47 0.61 - 1.24 mg/dL   Calcium 9.3 8.9 -  10.3 mg/dL   Total Protein 7.5 6.5 - 8.1 g/dL   Albumin 3.9 3.5 - 5.0 g/dL   AST 28 15 - 41 U/L    Comment: HEMOLYSIS AT THIS LEVEL MAY AFFECT RESULT   ALT 25 0 - 44 U/L    Comment: HEMOLYSIS AT THIS LEVEL MAY AFFECT RESULT   Alkaline Phosphatase 65 38 - 126 U/L   Total Bilirubin 1.7 (H) 0.3 - 1.2 mg/dL    Comment: HEMOLYSIS AT THIS LEVEL MAY AFFECT RESULT   GFR, Estimated >60 >60 mL/min    Comment: (NOTE) Calculated using the CKD-EPI Creatinine Equation (2021)    Anion gap 11 5 - 15    Comment: Performed at [facility], [address] [place], [place] [address]  CBC with  Differential     Status: Abnormal   Collection Time: 12/15/22  6:25 AM  Result Value Ref Range   WBC 13.1 (H) 4.0 - 10.5 K/uL   RBC 4.18 (L) 4.22 - 5.81 MIL/uL   Hemoglobin 12.9 (L) 13.0 - 17.0 g/dL   HCT 47.4 (L) 25.9 - 56.3 %   MCV 90.9 80.0 - 100.0 fL   MCH 30.9 26.0 - 34.0 pg   MCHC 33.9 30.0 - 36.0 g/dL   RDW 87.5 64.3 - 32.9 %   Platelets 190 150 - 400 K/uL   nRBC 0.0 0.0 - 0.2 %   Neutrophils Relative % 89 %   Neutro Abs 11.7 (H) 1.7 - 7.7 K/uL   Lymphocytes Relative 5 %   Lymphs Abs 0.7 0.7 - 4.0 K/uL   Monocytes Relative 5 %   Monocytes Absolute 0.6 0.1 - 1.0 K/uL   Eosinophils Relative 0 %   Eosinophils Absolute 0.0 0.0 - 0.5 K/uL   Basophils Relative 0 %   Basophils Absolute 0.0 0.0 - 0.1 K/uL   Immature Granulocytes 1 %   Abs Immature Granulocytes 0.14 (H) 0.00 - 0.07 K/uL    Comment: Performed at [facility], [address], [place], [place] [address]  Protime-INR     Status: None   Collection Time: 12/15/22  6:25 AM  Result Value Ref Range   Prothrombin Time 14.4 11.4 - 15.2 seconds   INR 1.1 0.8 - 1.2    Comment: (NOTE) INR goal varies based on device and disease states. Performed at [facility], [address] [place], [place] [address]   APTT     Status: None   Collection Time: 12/15/22  6:25 AM  Result Value Ref Range   aPTT 28 24 - 36 seconds    Comment: Performed at [facility], [address] [place], [place] [address]  Troponin I (High Sensitivity)     Status: None   Collection Time: 12/15/22  6:25 AM  Result Value Ref Range   Troponin I (High Sensitivity) 7 <18 ng/L    Comment: (NOTE) Elevated high sensitivity troponin I (hsTnI) values and significant  changes across serial measurements may suggest ACS but many other  chronic and acute conditions are known to elevate hsTnI results.  Refer to the "Links" section for chest pain algorithms and additional  guidance. Performed at [facility], [address],  [place], [place] [address]   Resp panel by RT-PCR (RSV, Flu A&B, Covid) Anterior Nasal Swab     Status: None   Collection Time: 12/15/22  6:26 AM   Specimen: Anterior Nasal Swab  Result Value Ref Range   SARS  Coronavirus 2 by RT PCR NEGATIVE NEGATIVE   Influenza A by PCR NEGATIVE NEGATIVE   Influenza B by PCR NEGATIVE NEGATIVE    Comment: (NOTE) The Xpert Xpress SARS-CoV-2/FLU/RSV plus assay is intended as an aid in the diagnosis of influenza from Nasopharyngeal swab specimens and should not be used as a sole basis for treatment. Nasal washings and aspirates are unacceptable for Xpert Xpress SARS-CoV-2/FLU/RSV testing.  Fact Sheet for Patients: BloggerCourse.com  Fact Sheet for Healthcare Providers: SeriousBroker.it  This test is not yet approved or cleared by the [place] FDA and has been authorized for detection and/or diagnosis of SARS-CoV-2 by FDA under an Emergency Use Authorization (EUA). This EUA will remain in effect (meaning this test can be used) for the duration of the COVID-19 declaration under Section 564(b)(1) of the Act, 21 U.S.C. section 360bbb-3(b)(1), unless the authorization is terminated or revoked.     Resp Syncytial Virus by PCR NEGATIVE NEGATIVE    Comment: (NOTE) Fact Sheet for Patients: BloggerCourse.com  Fact Sheet for Healthcare Providers: SeriousBroker.it  This test is not yet approved or cleared by the [place] FDA and has been authorized for detection and/or diagnosis of SARS-CoV-2 by FDA under an Emergency Use Authorization (EUA). This EUA will remain in effect (meaning this test can be used) for the duration of the COVID-19 declaration under Section 564(b)(1) of the Act, 21 U.S.C. section 360bbb-3(b)(1), unless the authorization is terminated or revoked.  Performed at [facility], [address] [place], [place] [address]    Lactic acid, plasma     Status: None   Collection Time: 12/15/22  6:26 AM  Result Value Ref Range   Lactic Acid, Venous 1.1 0.5 - 1.9 mmol/L    Comment: Performed at [facility], [address], [place], [place] [address]  Brain natriuretic peptide     Status: None   Collection Time: 12/15/22  6:27 AM  Result Value Ref Range   B Natriuretic Peptide 18.1 0.0 - 100.0 pg/mL    Comment: Performed at [facility], [address], [place], [place] [address]  Lactic acid, plasma     Status: None   Collection Time: 12/15/22  8:10 AM  Result Value Ref Range   Lactic Acid, Venous 1.1 0.5 - 1.9 mmol/L    Comment: Performed at [facility], [address], [place], [place] [address]  Urinalysis, w/ Reflex to Culture (Infection Suspected) -Urine, Clean Catch     Status: Abnormal   Collection Time: 12/15/22  8:10 AM  Result Value Ref Range   Specimen Source URINE, CLEAN CATCH    Color, Urine YELLOW YELLOW   APPearance CLEAR CLEAR   Specific Gravity, Urine 1.020 1.005 - 1.030   pH 5.0 5.0 - 8.0   Glucose, UA NEGATIVE NEGATIVE mg/dL   Hgb urine dipstick SMALL (A) NEGATIVE   Bilirubin Urine NEGATIVE NEGATIVE   Ketones, ur NEGATIVE NEGATIVE mg/dL   Protein, ur NEGATIVE NEGATIVE mg/dL   Nitrite NEGATIVE NEGATIVE   Leukocytes,Ua NEGATIVE NEGATIVE   RBC / HPF 0-5 0 - 5 RBC/hpf   WBC, UA 0-5 0 - 5 WBC/hpf    Comment:        Reflex urine culture not performed if WBC <=10, OR if Squamous epithelial cells >5. If Squamous epithelial cells >5 suggest recollection.    Bacteria, UA NONE SEEN NONE SEEN   Squamous Epithelial / HPF 0-5 0 - 5 /HPF   Mucus PRESENT  Comment: Performed at [facility], [address] [place], [place] [address]  Troponin I (High Sensitivity)     Status: None   Collection Time: 12/15/22  8:10 AM  Result Value Ref Range   Troponin I (High Sensitivity) 8 <18 ng/L    Comment: (NOTE) Elevated high sensitivity troponin I (hsTnI) values and  significant  changes across serial measurements may suggest ACS but many other  chronic and acute conditions are known to elevate hsTnI results.  Refer to the "Links" section for chest pain algorithms and additional  guidance. Performed at [facility], [address], [place], [place] [address]   HIV Antibody (routine testing w rflx)     Status: None   Collection Time: 12/15/22 11:11 AM  Result Value Ref Range   HIV Screen 4th Generation wRfx Non Reactive Non Reactive    Comment: Performed at [facility], [address] [place], [place] [address]  TSH     Status: None   Collection Time: 12/15/22 11:11 AM  Result Value Ref Range   TSH 1.008 0.350 - 4.500 uIU/mL    Comment: Performed by a 3rd Generation assay with a functional sensitivity of <=0.01 uIU/mL. Performed at [facility], [address], [place], [place] [address]   T4, free     Status: None   Collection Time: 12/15/22 11:11 AM  Result Value Ref Range   Free T4 0.78 0.61 - 1.12 ng/dL    Comment: (NOTE) Biotin ingestion may interfere with free T4 tests. If the results are inconsistent with the TSH level, previous test results, or the clinical presentation, then consider biotin interference. If needed, order repeat testing after stopping biotin. Performed at [facility], [address] [place], [place] [address]    ECHOCARDIOGRAM COMPLETE  Result Date: 12/15/2022    ECHOCARDIOGRAM REPORT   Patient Name:   Jake Fisher Date of Exam: 12/15/2022 Medical Rec #:  [id number]     Height:       67.0 in Accession #:    [id number]    Weight:       195.1 lb Date of Birth:  1960/08/24     BSA:          2.001 m Patient Age:    61 years      BP:           180/85 mmHg Patient Gender: M             HR:           122 bpm. Exam Location:  Inpatient Procedure: 2D Echo, Color Doppler and Cardiac Doppler STAT ECHO Indications:    Fever  History:        Patient has no prior history of Echocardiogram examinations.                  Arrythmias:Tachycardia; Signs/Symptoms:Fever.  Sonographer:    Milbert Coulter Referring Phys: [id number] MATTHEW J TRIFAN  Sonographer Comments: Suboptimal apical window. Image acquisition challenging due to respiratory motion and tachypnea. IMPRESSIONS  1. Left ventricular ejection fraction, by estimation, is 60 to 65%. The left ventricle has normal function. The left ventricle has no regional wall motion abnormalities. Left ventricular diastolic parameters are consistent with Grade I diastolic dysfunction (impaired relaxation).  2. Mild D-shape to interventricular septum suggests a degree of RV pressure/volume overload. Right ventricular systolic function is normal. The right ventricular size is mildly enlarged. Tricuspid regurgitation signal is  inadequate for assessing PA pressure.  3. The mitral valve is normal in structure. No evidence of mitral valve regurgitation. No evidence of mitral stenosis.  4. The aortic valve is tricuspid. Aortic valve regurgitation is not visualized. No aortic stenosis is present.  5. The inferior vena cava is normal in size with <50% respiratory variability, suggesting right atrial pressure of 8 mmHg.  6. No definite valvular vegetations but technically difficult images. FINDINGS  Left Ventricle: Left ventricular ejection fraction, by estimation, is 60 to 65%. The left ventricle has normal function. The left ventricle has no regional wall motion abnormalities. The left ventricular internal cavity size was normal in size. There is  no left ventricular hypertrophy. Left ventricular diastolic parameters are consistent with Grade I diastolic dysfunction (impaired relaxation). Right Ventricle: Mild D-shape to interventricular septum suggests a degree of RV pressure/volume overload. The right ventricular size is mildly enlarged. No increase in right ventricular wall thickness. Right ventricular systolic function is normal. Tricuspid regurgitation signal is inadequate for assessing PA  pressure. Left Atrium: Left atrial size was normal in size. Right Atrium: Right atrial size was normal in size. Pericardium: Trivial pericardial effusion is present. Mitral Valve: The mitral valve is normal in structure. No evidence of mitral valve regurgitation. No evidence of mitral valve stenosis. Tricuspid Valve: The tricuspid valve is normal in structure. Tricuspid valve regurgitation is trivial. Aortic Valve: The aortic valve is tricuspid. Aortic valve regurgitation is not visualized. No aortic stenosis is present. Aortic valve mean gradient measures 5.0 mmHg. Aortic valve peak gradient measures 8.5 mmHg. Aortic valve area, by VTI measures 1.33 cm. Pulmonic Valve: The pulmonic valve was normal in structure. Pulmonic valve regurgitation is not visualized. Aorta: The aortic root is normal in size and structure. Venous: The inferior vena cava is normal in size with less than 50% respiratory variability, suggesting right atrial pressure of 8 mmHg. IAS/Shunts: No atrial level shunt detected by color flow Doppler.  LEFT VENTRICLE PLAX 2D LVIDd:         3.60 cm   Diastology LVIDs:         1.70 cm   LV e' medial:    7.15 cm/s LV PW:         1.50 cm   LV E/e' medial:  11.0 LV IVS:        1.40 cm   LV e' lateral:   12.90 cm/s LVOT diam:     1.80 cm   LV E/e' lateral: 6.1 LV SV:         27 LV SV Index:   14 LVOT Area:     2.54 cm  RIGHT VENTRICLE RV S prime:     30.30 cm/s TAPSE (M-mode): 2.0 cm LEFT ATRIUM         Index LA diam:    2.50 cm 1.25 cm/m  AORTIC VALVE AV Area (Vmax):    1.53 cm AV Area (Vmean):   1.42 cm AV Area (VTI):     1.33 cm AV Vmax:           146.00 cm/s AV Vmean:          105.000 cm/s AV VTI:            0.205 m AV Peak Grad:      8.5 mmHg AV Mean Grad:      5.0 mmHg LVOT Vmax:         87.50 cm/s LVOT Vmean:        58.600 cm/s LVOT  VTI:          0.107 m LVOT/AV VTI ratio: 0.52  AORTA Ao Root diam: 3.10 cm Ao Asc diam:  2.70 cm MITRAL VALVE MV Area (PHT): 4.65 cm     SHUNTS MV Decel Time: 163  msec     Systemic VTI:  0.11 m MV E velocity: 78.80 cm/s   Systemic Diam: 1.80 cm MV A velocity: 108.00 cm/s MV E/A ratio:  0.73 Dalton McleanMD Electronically signed by Wilfred Lacy Signature Date/Time: 12/15/2022/12:00:43 PM    Final    DG CHEST PORT 1 VIEW  Result Date: 12/15/2022 CLINICAL DATA:  Fever and sepsis EXAM: PORTABLE CHEST 1 VIEW COMPARISON:  Chest radiograph dated 12/15/2022 FINDINGS: Low lung volumes. Increased bibasilar patchy opacities. Similar blunting of the bilateral costophrenic angles. No pneumothorax. Similar mildly enlarged cardiomediastinal silhouette. No acute osseous abnormality. IMPRESSION: Low lung volumes with increased bibasilar patchy opacities, which may represent atelectasis, aspiration, or pneumonia. Electronically Signed   By: Agustin Cree M.D.   On: 12/15/2022 11:10   CT ABDOMEN PELVIS W CONTRAST  Result Date: 12/15/2022 CLINICAL DATA:  Probability of pulmonary embolism. Acute generalized abdominal pain. EXAM: CT ANGIOGRAPHY CHEST CT ABDOMEN AND PELVIS WITH CONTRAST TECHNIQUE: Multidetector CT imaging of the chest was performed using the standard protocol during bolus administration of intravenous contrast. Multiplanar CT image reconstructions and MIPs were obtained to evaluate the vascular anatomy. Multidetector CT imaging of the abdomen and pelvis was performed using the standard protocol during bolus administration of intravenous contrast. RADIATION DOSE REDUCTION: This exam was performed according to the departmental dose-optimization program which includes automated exposure control, adjustment of the mA and/or kV according to patient size and/or use of iterative reconstruction technique. CONTRAST:  75mL OMNIPAQUE IOHEXOL 350 MG/ML SOLN COMPARISON:  None Available. FINDINGS: CTA CHEST FINDINGS Cardiovascular: Satisfactory opacification of the pulmonary arteries to the segmental level. No evidence of pulmonary embolism. Mild cardiomegaly. No pericardial effusion.  Mediastinum/Nodes: No enlarged mediastinal, hilar, or axillary lymph nodes. Thyroid gland, trachea, and esophagus demonstrate no significant findings. Lungs/Pleura: No pneumothorax or pleural effusion is noted. Mild bibasilar subsegmental atelectasis is noted. 3 mm subpleural nodule is noted laterally in left upper lobe best seen on image number 45 of series 7. Musculoskeletal: No chest wall abnormality. No acute or significant osseous findings. Review of the MIP images confirms the above findings. CT ABDOMEN and PELVIS FINDINGS Hepatobiliary: No focal liver abnormality is seen. No gallstones, gallbladder wall thickening, or biliary dilatation. Pancreas: Unremarkable. No pancreatic ductal dilatation or surrounding inflammatory changes. Spleen: Normal in size without focal abnormality. Adrenals/Urinary Tract: Adrenal glands are unremarkable. Kidneys are normal, without renal calculi, focal lesion, or hydronephrosis. Bladder is unremarkable. Stomach/Bowel: Stomach is within normal limits. Appendix appears normal. No evidence of bowel wall thickening, distention, or inflammatory changes. Vascular/Lymphatic: No significant vascular findings are present. No enlarged abdominal or pelvic lymph nodes. Reproductive: Mild prostatic enlargement. Other: Large left inguinal hernia is noted which contains a loop of sigmoid colon, but does not result in obstruction. Musculoskeletal: No acute or significant osseous findings. Review of the MIP images confirms the above findings. IMPRESSION: No definite evidence of pulmonary embolus. Mild bibasilar subsegmental atelectasis. 3 mm subpleural nodule seen in left upper lobe. Although likely benign, if the patient is high-risk, given the morphology and/or location of this nodule a non-contrast chest CT can be considered in 12 months.This recommendation follows the consensus statement: Guidelines for Management of Incidental Pulmonary Nodules Detected on CT Images: From the Fleischner  Society 2017; Radiology 2017; [phone]. Large left inguinal hernia which contains a loop of sigmoid colon, but does not result in obstruction. Mild prostatic enlargement. Electronically Signed   By: Lupita Raider M.D.   On: 12/15/2022 08:25   CT Angio Chest PE W and/or Wo Contrast  Result Date: 12/15/2022 CLINICAL DATA:  Probability of pulmonary embolism. Acute generalized abdominal pain. EXAM: CT ANGIOGRAPHY CHEST CT ABDOMEN AND PELVIS WITH CONTRAST TECHNIQUE: Multidetector CT imaging of the chest was performed using the standard protocol during bolus administration of intravenous contrast. Multiplanar CT image reconstructions and MIPs were obtained to evaluate the vascular anatomy. Multidetector CT imaging of the abdomen and pelvis was performed using the standard protocol during bolus administration of intravenous contrast. RADIATION DOSE REDUCTION: This exam was performed according to the departmental dose-optimization program which includes automated exposure control, adjustment of the mA and/or kV according to patient size and/or use of iterative reconstruction technique. CONTRAST:  75mL OMNIPAQUE IOHEXOL 350 MG/ML SOLN COMPARISON:  None Available. FINDINGS: CTA CHEST FINDINGS Cardiovascular: Satisfactory opacification of the pulmonary arteries to the segmental level. No evidence of pulmonary embolism. Mild cardiomegaly. No pericardial effusion. Mediastinum/Nodes: No enlarged mediastinal, hilar, or axillary lymph nodes. Thyroid gland, trachea, and esophagus demonstrate no significant findings. Lungs/Pleura: No pneumothorax or pleural effusion is noted. Mild bibasilar subsegmental atelectasis is noted. 3 mm subpleural nodule is noted laterally in left upper lobe best seen on image number 45 of series 7. Musculoskeletal: No chest wall abnormality. No acute or significant osseous findings. Review of the MIP images confirms the above findings. CT ABDOMEN and PELVIS FINDINGS Hepatobiliary: No focal liver  abnormality is seen. No gallstones, gallbladder wall thickening, or biliary dilatation. Pancreas: Unremarkable. No pancreatic ductal dilatation or surrounding inflammatory changes. Spleen: Normal in size without focal abnormality. Adrenals/Urinary Tract: Adrenal glands are unremarkable. Kidneys are normal, without renal calculi, focal lesion, or hydronephrosis. Bladder is unremarkable. Stomach/Bowel: Stomach is within normal limits. Appendix appears normal. No evidence of bowel wall thickening, distention, or inflammatory changes. Vascular/Lymphatic: No significant vascular findings are present. No enlarged abdominal or pelvic lymph nodes. Reproductive: Mild prostatic enlargement. Other: Large left inguinal hernia is noted which contains a loop of sigmoid colon, but does not result in obstruction. Musculoskeletal: No acute or significant osseous findings. Review of the MIP images confirms the above findings. IMPRESSION: No definite evidence of pulmonary embolus. Mild bibasilar subsegmental atelectasis. 3 mm subpleural nodule seen in left upper lobe. Although likely benign, if the patient is high-risk, given the morphology and/or location of this nodule a non-contrast chest CT can be considered in 12 months.This recommendation follows the consensus statement: Guidelines for Management of Incidental Pulmonary Nodules Detected on CT Images: From the Fleischner Society 2017; Radiology 2017; 284:228-243. Large left inguinal hernia which contains a loop of sigmoid colon, but does not result in obstruction. Mild prostatic enlargement. Electronically Signed   By: Lupita Raider M.D.   On: 12/15/2022 08:25   DG Chest Port 1 View  Result Date: 12/15/2022 CLINICAL DATA:  Questionable sepsis EXAM: PORTABLE CHEST 1 VIEW COMPARISON:  03/06/2021 FINDINGS: Cardiomegaly accentuated by low lung volumes. Mild atelectasis at the lung bases. There is no edema, consolidation, effusion, or pneumothorax. Artifact from EKG leads.  IMPRESSION: Low volume chest with mild atelectasis.  No convincing pneumonia. Electronically Signed   By: Tiburcio Pea M.D.   On: 12/15/2022 06:55    Pending Labs [employer] (From admission, onward)     Start     Ordered  12/15/22 1138  SARS Coronavirus 2 by RT PCR (hospital order, performed in [facility] hospital lab) *cepheid single result test* Anterior Nasal Swab  (Tier 2 - SARS Coronavirus 2 by RT PCR (hospital order, performed in [facility] hospital lab) *cepheid single result test*)  Once,   R        12/15/22 1137   12/15/22 1138  Respiratory (~20 pathogens) panel by PCR  (Respiratory panel by PCR (~20 pathogens, ~24 hr TAT)  w precautions)  Once,   R        12/15/22 1137   12/15/22 0626  Blood Culture (routine x 2)  (Septic presentation on arrival (screening labs, nursing and treatment orders for obvious sepsis))  BLOOD CULTURE X 2,   STAT      12/15/22 0626            Vitals/Pain Today's Vitals   12/15/22 1003 12/15/22 1300 12/15/22 1400 12/15/22 1407  BP: (!) 180/85 119/86 (!) 145/73   Pulse: (!) 107 (!) 132 (!) 139   Resp: (!) 24     Temp:    (!) 102.7 F (39.3 C)  TempSrc:    Oral  SpO2: 97% 93% 94%   Weight:      Height:      PainSc:        Isolation Precautions Droplet precaution  Medications Medications  ceFEPIme (MAXIPIME) 2 g in sodium chloride 0.9 % 100 mL IVPB (has no administration in time range)  vancomycin (VANCOREADY) IVPB 1250 mg/250 mL (has no administration in time range)  enoxaparin (LOVENOX) injection 40 mg (40 mg Subcutaneous Not Given 12/15/22 1106)  ipratropium (ATROVENT) nebulizer solution 0.5 mg (has no administration in time range)  acetaminophen (TYLENOL) tablet 1,000 mg (1,000 mg Oral Given 12/15/22 0655)  lactated ringers bolus 1,000 mL (0 mLs Intravenous Stopped 12/15/22 0817)    And  lactated ringers bolus 1,000 mL (0 mLs Intravenous Stopped 12/15/22 0911)  ceFEPIme (MAXIPIME) 2 g in sodium chloride 0.9 % 100 mL IVPB (0 g  Intravenous Stopped 12/15/22 0741)  metroNIDAZOLE (FLAGYL) IVPB 500 mg (0 mg Intravenous Stopped 12/15/22 0929)  vancomycin (VANCOREADY) IVPB 1500 mg/300 mL (0 mg Intravenous Stopped 12/15/22 1240)  iohexol (OMNIPAQUE) 350 MG/ML injection 75 mL (75 mLs Intravenous Contrast Given 12/15/22 0804)  ipratropium-albuterol (DUONEB) 0.5-2.5 (3) MG/3ML nebulizer solution 3 mL (3 mLs Nebulization Given 12/15/22 0918)  morphine (PF) 4 MG/ML injection 4 mg (4 mg Intravenous Given 12/15/22 0919)    Mobility walks     Focused Assessments Pulmonary Assessment Handoff:  Lung sounds:   O2 Device: Room Air      R Recommendations: See Admitting Provider Note  Report given to:   Additional Notes:Febrile

## 2022-12-15 NOTE — Progress Notes (Signed)
PHARMACY - PHYSICIAN COMMUNICATION CRITICAL VALUE ALERT - BLOOD CULTURE IDENTIFICATION (BCID)  Jake Fisher is an 62 y.o. male who presented to [facility] on 12/15/2022 with a chief complaint of R flank pain and fever.  Assessment:  Pt with strep pneumo bacteremia    Name of physician (or Provider) Contacted: Dr. Cliffton Asters  Current antibiotics: Cefepime and Vancomycin  Changes to prescribed antibiotics recommended:  Narrowed to Rocephin 2gm IV q24h  Results for orders placed or performed during the hospital encounter of 12/15/22  Blood Culture ID Panel (Reflexed) (Collected: 12/15/2022  6:25 AM)  Result Value Ref Range   Enterococcus faecalis NOT DETECTED NOT DETECTED   Enterococcus Faecium NOT DETECTED NOT DETECTED   Listeria monocytogenes NOT DETECTED NOT DETECTED   Staphylococcus species NOT DETECTED NOT DETECTED   Staphylococcus aureus (BCID) NOT DETECTED NOT DETECTED   Staphylococcus epidermidis NOT DETECTED NOT DETECTED   Staphylococcus lugdunensis NOT DETECTED NOT DETECTED   Streptococcus species DETECTED (A) NOT DETECTED   Streptococcus agalactiae NOT DETECTED NOT DETECTED   Streptococcus pneumoniae DETECTED (A) NOT DETECTED   Streptococcus pyogenes NOT DETECTED NOT DETECTED   A.calcoaceticus-baumannii NOT DETECTED NOT DETECTED   Bacteroides fragilis NOT DETECTED NOT DETECTED   Enterobacterales NOT DETECTED NOT DETECTED   Enterobacter cloacae complex NOT DETECTED NOT DETECTED   Escherichia coli NOT DETECTED NOT DETECTED   Klebsiella aerogenes NOT DETECTED NOT DETECTED   Klebsiella oxytoca NOT DETECTED NOT DETECTED   Klebsiella pneumoniae NOT DETECTED NOT DETECTED   Proteus species NOT DETECTED NOT DETECTED   Salmonella species NOT DETECTED NOT DETECTED   Serratia marcescens NOT DETECTED NOT DETECTED   Haemophilus influenzae NOT DETECTED NOT DETECTED   Neisseria meningitidis NOT DETECTED NOT DETECTED   Pseudomonas aeruginosa NOT DETECTED NOT DETECTED   Stenotrophomonas  maltophilia NOT DETECTED NOT DETECTED   Candida albicans NOT DETECTED NOT DETECTED   Candida auris NOT DETECTED NOT DETECTED   Candida glabrata NOT DETECTED NOT DETECTED   Candida krusei NOT DETECTED NOT DETECTED   Candida parapsilosis NOT DETECTED NOT DETECTED   Candida tropicalis NOT DETECTED NOT DETECTED   Cryptococcus neoformans/gattii NOT DETECTED NOT DETECTED    Christoper Fabian, PharmD, BCPS Please see amion for complete clinical pharmacist phone list 12/15/2022  8:29 PM

## 2022-12-16 DIAGNOSIS — I10 Essential (primary) hypertension: Secondary | ICD-10-CM

## 2022-12-16 DIAGNOSIS — D649 Anemia, unspecified: Secondary | ICD-10-CM | POA: Diagnosis not present

## 2022-12-16 DIAGNOSIS — B953 Streptococcus pneumoniae as the cause of diseases classified elsewhere: Secondary | ICD-10-CM

## 2022-12-16 DIAGNOSIS — R6510 Systemic inflammatory response syndrome (SIRS) of non-infectious origin without acute organ dysfunction: Secondary | ICD-10-CM | POA: Diagnosis not present

## 2022-12-16 LAB — CULTURE, BLOOD (ROUTINE X 2)

## 2022-12-16 LAB — CBC
HCT: 36.7 % — ABNORMAL LOW (ref 39.0–52.0)
Hemoglobin: 11.8 g/dL — ABNORMAL LOW (ref 13.0–17.0)
MCH: 30.2 pg (ref 26.0–34.0)
MCHC: 32.2 g/dL (ref 30.0–36.0)
MCV: 93.9 fL (ref 80.0–100.0)
Platelets: 164 10*3/uL (ref 150–400)
RBC: 3.91 MIL/uL — ABNORMAL LOW (ref 4.22–5.81)
RDW: 13.8 % (ref 11.5–15.5)
WBC: 24.5 10*3/uL — ABNORMAL HIGH (ref 4.0–10.5)
nRBC: 0 % (ref 0.0–0.2)

## 2022-12-16 LAB — BASIC METABOLIC PANEL
Anion gap: 11 (ref 5–15)
BUN: 20 mg/dL (ref 8–23)
CO2: 23 mmol/L (ref 22–32)
Calcium: 8.8 mg/dL — ABNORMAL LOW (ref 8.9–10.3)
Chloride: 103 mmol/L (ref 98–111)
Creatinine, Ser: 0.99 mg/dL (ref 0.61–1.24)
GFR, Estimated: >60 mL/min (ref >60)
Glucose, Bld: 127 mg/dL — ABNORMAL HIGH (ref 70–99)
Potassium: 3.7 mmol/L (ref 3.5–5.1)
Sodium: 137 mmol/L (ref 135–145)

## 2022-12-16 MED ORDER — LOSARTAN POTASSIUM 50 MG PO TABS
50.0000 mg | ORAL_TABLET | Freq: Every day | ORAL | Status: DC
  Administered 2022-12-16 – 2022-12-17 (×2): 50 mg via ORAL
  Filled 2022-12-16 (×2): qty 1

## 2022-12-16 MED ORDER — ACETAMINOPHEN 500 MG PO TABS
1000.0000 mg | ORAL_TABLET | Freq: Four times a day (QID) | ORAL | Status: DC | PRN
  Administered 2022-12-16 – 2022-12-17 (×3): 1000 mg via ORAL
  Filled 2022-12-16 (×2): qty 2

## 2022-12-16 MED ORDER — IPRATROPIUM BROMIDE HFA 17 MCG/ACT IN AERS: 1.0000 | INHALATION_SPRAY | RESPIRATORY_TRACT | Status: DC | PRN

## 2022-12-16 MED ORDER — POLYETHYLENE GLYCOL 3350 17 G PO PACK
17.0000 g | PACK | Freq: Every day | ORAL | Status: DC
  Administered 2022-12-16 – 2022-12-17 (×2): 17 g via ORAL
  Filled 2022-12-16 (×2): qty 1

## 2022-12-16 NOTE — Plan of Care (Signed)

## 2022-12-16 NOTE — Progress Notes (Addendum)
   Subjective:  Jake Fisher is a 62 yo person living with hypertension admitted with systemic inflammatory response most likely secondary to streptococcus pneumoniae bacteremia.  Currently, the patient is doing well. Reports reduced shortness of breath and improved pain with medication. He continues to experience some mild, intermittent abdominal pain that impacts his ability to breath. Notes that his last bowel movement was 2 days ago, still passing gas.   Interval Events: - None    Objective:  Vital Signs:   Temp: 98.5 F (36.9 C)  Pulse Rate:  [97-139] 100  Resp:  [17-28] 21 BP: (105-180)/(56-93) 145/87  SpO2:  [93 %-99 %] 98 %  Weight:  [88.5 kg-92.9 kg] 92.9 kg  Last BM Date : 12/14/22  Physical Exam: General: Vital signs reviewed and noted. Well-developed, well-nourished, in no acute distress; alert, appropriate and cooperative throughout examination.  Lungs:  Normal respiratory effort on RA. Decreased breath sounds, bilaterally. No wheezing.  Heart: RRR. S1 and S2 normal without gallop, murmur, or rubs.  Abdomen:  BS normoactive. Soft, mild distended.    Intake/Output:   Intake/Output Summary (Last 24 hours) at 12/16/2022 0629 Last data filed at 12/16/2022 0300 Gross per 24 hour  Intake 1209.83 ml  Output 1040 ml  Net 169.83 ml     Labs:  Blood cultures: positive for streptococcus pneumoniae  RPP: negative  Covid: negative   BMP: pending CBC: pending   Basic Metabolic Panel: Lab 12/15/22 0625 12/16/22 0734  NA 134* 137  K 4.3 3.7  CL 100 103  CO2 23 23  GLUCOSE 153* 127*  BUN 8 20  CREATININE 1.08 0.99  CALCIUM 9.3 8.8*    CBC: Lab 12/15/22 0625 12/16/22 0734  WBC 13.1* 24.5*  NEUTROABS 11.7*  --   HGB 12.9* 11.8*  HCT 38.0* 36.7*  MCV 90.9 93.9  PLT 190 164     Assessment & Plan:  Jake Fisher is 62 yo person living with hypertension admitted 5/8 with concern for systemic inflammatory response secondary to strep pneumoniae  bacteremia.  #Bacteremia (S. Pneumo) Patient's febrile illness on presentation is most likely secondary to streptococcus pneumoniae bacteremia. Source is unknown. No signs of endocarditis on echo, physical exam. No puncture wounds noted. Patient was started on broad spectrum antibiotics (vancomycin/cefepime) then transitioned to IV ceftriaxone yesterday evening. Appears to be improving on antibiotics. No longer tachycardic this morning and afebrile with tylenol. Of note, patient's WBC has increased (13.1>24.5).  - Continue IV ceftriaxone 2g daily (day 2, 8-10 day course)  - Repeat blood cultures   #Hypertension  Patient remains hypertensive this morning. Has not received home medications during hospitalization. - Consider losartan 50 mg daily    #Chronic normocytic anemia Once patient is over acute illness would repeat CBC and obtain iron studies. - Daily CBC  Length of Stay: 1 day(s)  Signed:  Lyda Kalata, Medical Student 12/16/2022, 6:10 AM

## 2022-12-16 NOTE — Hospital Course (Addendum)
#  Systemic Inflammatory Response  Streptococcus Pneumoniae Bacteremia  Patient is 62 yo person living with hypertension who presented to ED on 05/08 with abdominal pain for one day. Patient reports that he began having sharp right flank pain and subjective fever. No other symptoms, including nausea, vomiting, diarrhea. Patient has history of intermittent constipation and chronic cough, but this has not changed recently. States he is chronically short of breath on exertion and needs multiple pillows to sleep at night due to orthopnea, but this has also not changed recently. He denies lower extremity edema or PND, although patient's niece reports he does occasionally have apneic episodes when sleeping. Denies diplopia, peripheral vision loss, mental status changes, focal weakness or sensation changes, chest pains, palpitations, dysuria, polyuria, rashes. On admission, patient was febrile to 102F and tachycardic to 130-140s. Lactate was normal. Imaging studies (CXR, CT abdomen) were unremarkable with no evidence of PE, pneumonia, pneumothorax. Troponins negative, no ischemic changes on ECG. ECHO demonstrated mild RV overload, otherwise unremarkable. Thyroid studies were normal. Blood culture, urine culture were collected and patient was started on broad-spectrum antibiotics (vanc/cef). Blood cultures were positive for streptococcus pneumoniae and antibiotics were narrowed to IV ceftriaxone. On hospital day 1, patient improved with reduced abdominal pain, shortness of breath. He was afebrile with normal heart rate. Repeat cultures were collected. On hospital day 2 prior to discharge, patient continued to feel better. Vitals were stable. WBC count had also decreased (24.5>17.9). Repeat cultures demonstrated no growth at <24 hours. Susceptibilities were pan-sensitive. Patient was transitioned from IV ceftriaxone to Augmentin (875/125 mg) PO daily for a total 10 day course (ending on 05/17). Close outpatient follow-up  with [facility] was scheduled for the following.    #Hypertension  Patient's blood pressure on admission was elevated (SBP>140). His home medications were initially held due to concern for sepsis. Blood pressure improved with resumption of home losartan prior to discharge and patient was normotensive (119/80). Advised to continue home anti-htn regimen.  #Chronic normocytic anemia Patient has a history of normocytic anemia. Hgb on admission was near his baseline (~12). He may benefit from iron panel, ferritin in the outpatient setting. No dizziness, lightheadedness. PT evaluated without outpatient recommendations on discharge.   - Daily CBC   #Subpleural nodule CT Angio Chest (PE) on admission demonstrated a 3 mm subpleural nodule in left upper lobe. Recommended to receive repeat CT in 12 months.

## 2022-12-17 ENCOUNTER — Other Ambulatory Visit (HOSPITAL_COMMUNITY): Payer: Self-pay

## 2022-12-17 ENCOUNTER — Encounter (HOSPITAL_COMMUNITY): Payer: Self-pay | Admitting: Infectious Diseases

## 2022-12-17 DIAGNOSIS — R911 Solitary pulmonary nodule: Secondary | ICD-10-CM

## 2022-12-17 DIAGNOSIS — I10 Essential (primary) hypertension: Secondary | ICD-10-CM | POA: Insufficient documentation

## 2022-12-17 DIAGNOSIS — D649 Anemia, unspecified: Secondary | ICD-10-CM | POA: Insufficient documentation

## 2022-12-17 DIAGNOSIS — R6510 Systemic inflammatory response syndrome (SIRS) of non-infectious origin without acute organ dysfunction: Secondary | ICD-10-CM | POA: Diagnosis not present

## 2022-12-17 DIAGNOSIS — Z87898 Personal history of other specified conditions: Secondary | ICD-10-CM | POA: Insufficient documentation

## 2022-12-17 DIAGNOSIS — R7881 Bacteremia: Secondary | ICD-10-CM | POA: Insufficient documentation

## 2022-12-17 LAB — CULTURE, BLOOD (ROUTINE X 2)

## 2022-12-17 LAB — CBC
HCT: 34.9 % — ABNORMAL LOW (ref 39.0–52.0)
Hemoglobin: 11.7 g/dL — ABNORMAL LOW (ref 13.0–17.0)
MCH: 30.5 pg (ref 26.0–34.0)
MCHC: 33.5 g/dL (ref 30.0–36.0)
MCV: 91.1 fL (ref 80.0–100.0)
Platelets: 154 10*3/uL (ref 150–400)
RBC: 3.83 MIL/uL — ABNORMAL LOW (ref 4.22–5.81)
RDW: 13.6 % (ref 11.5–15.5)
WBC: 17.9 10*3/uL — ABNORMAL HIGH (ref 4.0–10.5)
nRBC: 0 % (ref 0.0–0.2)

## 2022-12-17 LAB — BASIC METABOLIC PANEL
Anion gap: 6 (ref 5–15)
BUN: 17 mg/dL (ref 8–23)
CO2: 24 mmol/L (ref 22–32)
Calcium: 8.7 mg/dL — ABNORMAL LOW (ref 8.9–10.3)
Chloride: 102 mmol/L (ref 98–111)
Creatinine, Ser: 0.86 mg/dL (ref 0.61–1.24)
GFR, Estimated: >60 mL/min (ref >60)
Glucose, Bld: 158 mg/dL — ABNORMAL HIGH (ref 70–99)
Potassium: 3.6 mmol/L (ref 3.5–5.1)
Sodium: 132 mmol/L — ABNORMAL LOW (ref 135–145)

## 2022-12-17 MED ORDER — AMOXICILLIN-POT CLAVULANATE 500-125 MG PO TABS
2.0000 | ORAL_TABLET | Freq: Three times a day (TID) | ORAL | 0 refills | Status: DC
  Filled 2022-12-17: qty 42, 7d supply, fill #0

## 2022-12-17 MED ORDER — SENNOSIDES-DOCUSATE SODIUM 8.6-50 MG PO TABS
1.0000 | ORAL_TABLET | Freq: Once | ORAL | Status: AC
  Administered 2022-12-17: 1 via ORAL
  Filled 2022-12-17: qty 1

## 2022-12-17 MED ORDER — AMOXICILLIN-POT CLAVULANATE 875-125 MG PO TABS
1.0000 | ORAL_TABLET | Freq: Two times a day (BID) | ORAL | 0 refills | Status: DC
  Filled 2022-12-17: qty 14, 7d supply, fill #0

## 2022-12-17 NOTE — Care Management (Signed)
  Transition of Care [facility]) Screening Note   Patient Details  Name: Jake Fisher Date of Birth: [birth year]   Transition of [facility]) CM/SW Contact:    Gala Lewandowsky, RN Phone Number: 12/17/2022, 1:52 PM    Transition of Care Department [facility]) has reviewed the patient. Case Manager received a message from OT that the patient wanted a bedside commode. Referral submitted to [employer] and the office will deliver to the bedside. No further needs identified at this time.

## 2022-12-17 NOTE — Discharge Instructions (Signed)
You were hospitalized and found to have a bacteria in your blood stream called streptococcus pneumoniae. You were treated with IV antibiotics and your blood cultures no longer showed growth of this bacteria. You will be discharged home with 7 more days of antibiotics. We have set you up for a hospital follow up appointment in our clinic. At home:  Take Augmentin 875-125mg  twice daily for 7 days starting 12/18/2022  It was a pleasure caring for you!Marland Kitchen

## 2022-12-17 NOTE — Plan of Care (Signed)
  Problem: Education: Goal: Knowledge of General Education information will improve Description: Including pain rating scale, medication(s)/side effects and non-pharmacologic comfort measures 12/17/2022 1239 by Herma Carson, RN Outcome: Adequate for Discharge 12/17/2022 0802 by Herma Carson, RN Outcome: Progressing   Problem: Health Behavior/Discharge Planning: Goal: Ability to manage health-related needs will improve 12/17/2022 1239 by Herma Carson, RN Outcome: Adequate for Discharge 12/17/2022 0802 by Herma Carson, RN Outcome: Progressing   Problem: Clinical Measurements: Goal: Ability to maintain clinical measurements within normal limits will improve 12/17/2022 1239 by Herma Carson, RN Outcome: Adequate for Discharge 12/17/2022 0802 by Herma Carson, RN Outcome: Progressing Goal: Will remain free from infection 12/17/2022 1239 by Herma Carson, RN Outcome: Adequate for Discharge 12/17/2022 0802 by Herma Carson, RN Outcome: Progressing Goal: Diagnostic test results will improve 12/17/2022 1239 by Herma Carson, RN Outcome: Adequate for Discharge 12/17/2022 0802 by Herma Carson, RN Outcome: Progressing Goal: Respiratory complications will improve 12/17/2022 1239 by Herma Carson, RN Outcome: Adequate for Discharge 12/17/2022 0802 by Herma Carson, RN Outcome: Progressing Goal: Cardiovascular complication will be avoided 12/17/2022 1239 by Herma Carson, RN Outcome: Adequate for Discharge 12/17/2022 0802 by Herma Carson, RN Outcome: Progressing   Problem: Activity: Goal: Risk for activity intolerance will decrease 12/17/2022 1239 by Herma Carson, RN Outcome: Adequate for Discharge 12/17/2022 0802 by Herma Carson, RN Outcome: Progressing   Problem: Nutrition: Goal: Adequate nutrition will be maintained 12/17/2022 1239 by Herma Carson, RN Outcome: Adequate for Discharge 12/17/2022 0802 by Herma Carson, RN Outcome: Progressing    Problem: Coping: Goal: Level of anxiety will decrease 12/17/2022 1239 by Herma Carson, RN Outcome: Adequate for Discharge 12/17/2022 0802 by Herma Carson, RN Outcome: Progressing   Problem: Elimination: Goal: Will not experience complications related to bowel motility 12/17/2022 1239 by Herma Carson, RN Outcome: Adequate for Discharge 12/17/2022 0802 by Herma Carson, RN Outcome: Progressing Goal: Will not experience complications related to urinary retention 12/17/2022 1239 by Herma Carson, RN Outcome: Adequate for Discharge 12/17/2022 0802 by Herma Carson, RN Outcome: Progressing   Problem: Pain Managment: Goal: General experience of comfort will improve 12/17/2022 1239 by Herma Carson, RN Outcome: Adequate for Discharge 12/17/2022 0802 by Herma Carson, RN Outcome: Progressing   Problem: Safety: Goal: Ability to remain free from injury will improve 12/17/2022 1239 by Herma Carson, RN Outcome: Adequate for Discharge 12/17/2022 0802 by Herma Carson, RN Outcome: Progressing   Problem: Skin Integrity: Goal: Risk for impaired skin integrity will decrease 12/17/2022 1239 by Herma Carson, RN Outcome: Adequate for Discharge 12/17/2022 0802 by Herma Carson, RN Outcome: Progressing

## 2022-12-17 NOTE — Evaluation (Addendum)
Occupational Therapy Evaluation Patient Details Name: Jake Fisher MRN: [id number] DOB: 27-May-1961 Today's Date: 12/17/2022   History of Present Illness 62 year old male presented to [facility] on  12/15/22 for SIRS likely secondary to streptococcus pneumoniae bacteremia of unknown source. Imaging revealed large non-obstructed left inguinal hernia . PMH - HTN   Clinical Impression   PTA, pt was independent, working, and living with his sister and her children. Pt currently performing ADLs and functional mobility at Mod I level with increased time for fatigue. Pt demonstrating poor activity tolerance and reports continued pain at abdomin. SpO2 95% on RA. HR elevating to 110-120s with activity. Pt denies any SOB during session. Pt would benefit from further acute OT to facilitate safe dc. Recommend dc to home once medically stable per physician.    Recommendations for follow up therapy are one component of a multi-disciplinary discharge planning process, led by the attending physician.  Recommendations may be updated based on patient status, additional functional criteria and insurance authorization.   Assistance Recommended at Discharge Intermittent Supervision/Assistance  Patient can return home with the following Assistance with cooking/housework;Assist for transportation    Functional Status Assessment  Patient has had a recent decline in their functional status and demonstrates the ability to make significant improvements in function in a reasonable and predictable amount of time.  Equipment Recommendations  BSC/3in1    Recommendations for Other Services       Precautions / Restrictions Precautions Precautions: None      Mobility Bed Mobility Overal bed mobility: Modified Independent                  Transfers Overall transfer level: Modified independent Equipment used: None                      Balance Overall balance assessment: Mild deficits observed, not  formally tested                                         ADL either performed or assessed with clinical judgement   ADL Overall ADL's : Modified independent                                       General ADL Comments: Increased time for fatigue and decreased activity tolerance.     Vision         Perception     Praxis      Pertinent Vitals/Pain Pain Assessment Pain Assessment: 0-10 Pain Score: 4  Pain Location: abd Pain Descriptors / Indicators: Tightness Pain Intervention(s): Monitored during session, Repositioned     Hand Dominance Right   Extremity/Trunk Assessment Upper Extremity Assessment Upper Extremity Assessment: Overall WFL for tasks assessed   Lower Extremity Assessment Lower Extremity Assessment: Defer to PT evaluation   Cervical / Trunk Assessment Cervical / Trunk Assessment: Normal   Communication Communication Communication: No difficulties   Cognition Arousal/Alertness: Awake/alert Behavior During Therapy: WFL for tasks assessed/performed Overall Cognitive Status: Within Functional Limits for tasks assessed                                       General Comments  HR 110s and elevating to 120s during grooming  task and walking. SpO2 95% on RA during session.    Exercises     Shoulder Instructions      Home Living Family/patient expects to be discharged to:: Private residence Living Arrangements: Other relatives (niece) Available Help at Discharge: Family;Available PRN/intermittently Type of Home: House Home Access: Level entry     Home Layout: Two level;Able to live on main level with bedroom/bathroom     Bathroom Shower/Tub: Producer, television/film/video: Standard     Home Equipment: None          Prior Functioning/Environment Prior Level of Function : Independent/Modified Independent;Working/employed               ADLs Comments: Works full time at a [employer], [employer].  Does a lot of walking. ADLs, IADLs, does not drive        OT Problem List: Decreased strength;Decreased range of motion;Decreased activity tolerance;Impaired balance (sitting and/or standing);Decreased knowledge of precautions;Decreased knowledge of use of DME or AE      OT Treatment/Interventions: Self-care/ADL training;Energy conservation;Patient/family education    OT Goals(Current goals can be found in the care plan section) Acute Rehab OT Goals Patient Stated Goal: Go home OT Goal Formulation: With patient Time For Goal Achievement: 12/31/22 Potential to Achieve Goals: Good ADL Goals Pt Will Perform Grooming: Independently;standing Pt Will Transfer to Toilet: Independently;ambulating;regular height toilet Additional ADL Goal #1: Pt will independently verbalize 3/3 energy conservation techniques for ADLs/IADLs  OT Frequency: Min 2X/week    Co-evaluation              AM-PAC OT "6 Clicks" Daily Activity     Outcome Measure Help from another person eating meals?: None Help from another person taking care of personal grooming?: None Help from another person toileting, which includes using toliet, bedpan, or urinal?: None Help from another person bathing (including washing, rinsing, drying)?: None Help from another person to put on and taking off regular upper body clothing?: None Help from another person to put on and taking off regular lower body clothing?: None 6 Click Score: 24   End of Session Nurse Communication: Mobility status  Activity Tolerance: Patient tolerated treatment well Patient left: in chair;with call bell/phone within reach  OT Visit Diagnosis: Unsteadiness on feet (R26.81);Other abnormalities of gait and mobility (R26.89);Muscle weakness (generalized) (M62.81)                Time: 4098-1191 OT Time Calculation (min): 22 min Charges:  OT General Charges $OT Visit: 1 Visit OT Evaluation $OT Eval Low Complexity: 1 Low  Dash Cardarelli MSOT,  OTR/L Acute Rehab Office: [phone]   Jake Fisher 12/17/2022, 10:29 AM

## 2022-12-17 NOTE — Discharge Summary (Signed)
Name: Jake Fisher MRN: [id number] DOB: 01/29/1961 62 y.o. PCP: Pcp, No  Date of Admission: 12/15/2022  6:10 AM Date of Discharge: 12/17/2022 Attending Physician: Ginnie Smart, MD  Discharge Diagnosis: 1. Principal Problem:   Severe systemic inflammatory response syndrome (SIRS) (HCC) Active Problems:   Bacteremia   HTN (hypertension)   Normocytic anemia    Discharge Medications: Allergies as of 12/17/2022   No Known Allergies      Medication List     TAKE these medications    amoxicillin-clavulanate 875-125 MG tablet Commonly known as: AUGMENTIN Take 1 tablet by mouth 2 (two) times daily for 7 days. Start taking on: Dec 18, 2022   losartan 50 MG tablet Commonly known as: COZAAR Take 50 mg by mouth daily.   rosuvastatin 20 MG tablet Commonly known as: CRESTOR Take 20 mg by mouth daily.   VITAMIN D-3 PO Take 1 capsule by mouth daily.        Disposition and follow-up:   Jake Fisher was discharged from [facility] in Good condition.  At the hospital follow up visit please address:  1.   Strep Pneumo bacteremia -likely dental source, needs to see dentis -repeat cbc -f/u blood cultures  Wheezing -consider PFTs  #Chronic normocytic anemia -consider B12, iron studies  #Subpleural nodule  Recommended to receive repeat CT in 12 months.   2.  Labs / imaging needed at time of follow-up: CBC  3.  Pending labs/ test needing follow-up: Blood culture  Follow-up Appointments:  12/24/2022 10:45 AM Modena Slater, DO [place] [facility] Course by problem list: 1.  #Systemic Inflammatory Response  Streptococcus Pneumoniae Bacteremia  Patient is 62 yo person living with hypertension who presented to ED on 05/08 with abdominal pain for one day. Patient reports that he began having sharp right flank pain and subjective fever. No other symptoms, including nausea, vomiting, diarrhea. Patient has history of  intermittent constipation and chronic cough, but this has not changed recently. States he is chronically short of breath on exertion and needs multiple pillows to sleep at night due to orthopnea, but this has also not changed recently. He denies lower extremity edema or PND, although patient's niece reports he does occasionally have apneic episodes when sleeping. Denies diplopia, peripheral vision loss, mental status changes, focal weakness or sensation changes, chest pains, palpitations, dysuria, polyuria, rashes. On admission, patient was febrile to 102F and tachycardic to 130-140s. Lactate was normal. Imaging studies (CXR, CT abdomen) were unremarkable with no evidence of PE, pneumonia, pneumothorax. Troponins negative, no ischemic changes on ECG. ECHO demonstrated mild RV overload, otherwise unremarkable. Thyroid studies were normal. Blood culture, urine culture were collected and patient was started on broad-spectrum antibiotics (vanc/cef). Blood cultures were positive for streptococcus pneumoniae and antibiotics were narrowed to IV ceftriaxone. On hospital day 1, patient improved with reduced abdominal pain, shortness of breath. He was afebrile with normal heart rate. Repeat cultures were collected. On hospital day 2 prior to discharge, patient continued to feel better. Vitals were stable. WBC count had also decreased (24.5>17.9). Repeat cultures demonstrated no growth at <24 hours. Given no persistent fever, no persistent bacteremia, no regurgitant murmur, no echo abnormalities and low incidence of S. Pneumo endocarditis, TEE was not obtained. Susceptibilities were pan-sensitive. Patient was transitioned from IV ceftriaxone to Augmentin (875/125 mg) PO daily for a total 10 day course (ending on 05/17). Close outpatient follow-up with [facility] was scheduled for the following.    #  Hypertension  Patient's blood pressure on admission was elevated (SBP>140). His home medications were initially held due to concern  for sepsis. Blood pressure improved with resumption of home losartan prior to discharge and patient was normotensive (119/80). Advised to continue home anti-htn regimen.  #Chronic normocytic anemia Patient has a history of normocytic anemia. Hgb on admission was near his baseline (~12). He may benefit from iron panel, ferritin in the outpatient setting. No dizziness, lightheadedness. PT evaluated without outpatient recommendations on discharge.    #Subpleural nodule CT Angio Chest (PE) on admission demonstrated a 3 mm subpleural nodule in left upper lobe. Recommended to receive repeat CT in 12 months.    Discharge Exam:   BP (!) 150/99 (BP Location: Left Arm)   Pulse 88   Temp 98.4 F (36.9 C) (Oral)   Resp 19   Ht 5\' 7"  (1.702 m)   Wt 92.9 kg   SpO2 99%   BMI 32.08 kg/m  Discharge exam:  General: well developed, well nourished, no acute distress PulmL normal respiratory effort on 1L [place], diffuse bilateral wheezing CV:RRR, normal s1/s2, no m/r/g GI: soft, NT/ND Extremities: warm, dry, no LE edema  Pertinent Labs, Studies, and Procedures:     Latest Ref Rng & Units 12/17/2022    1:53 AM 12/16/2022    7:34 AM 12/15/2022    6:25 AM  CBC  WBC 4.0 - 10.5 K/uL 17.9  24.5  13.1   Hemoglobin 13.0 - 17.0 g/dL 40.9  81.1  91.4   Hematocrit 39.0 - 52.0 % 34.9  36.7  38.0   Platelets 150 - 400 K/uL 154  164  190        Latest Ref Rng & Units 12/17/2022    1:53 AM 12/16/2022    7:34 AM 12/15/2022    6:25 AM  BMP  Glucose 70 - 99 mg/dL 782  956  213   BUN 8 - 23 mg/dL 17  20  8    Creatinine 0.61 - 1.24 mg/dL 0.86  5.78  4.69   Sodium 135 - 145 mmol/L 132  137  134   Potassium 3.5 - 5.1 mmol/L 3.6  3.7  4.3   Chloride 98 - 111 mmol/L 102  103  100   CO2 22 - 32 mmol/L 24  23  23    Calcium 8.9 - 10.3 mg/dL 8.7  8.8  9.3    ECHOCARDIOGRAM COMPLETE  Result Date: 12/15/2022    ECHOCARDIOGRAM REPORT   Patient Name:   Jake Fisher Date of Exam: 12/15/2022 Medical Rec #:  [id number]     Height:        67.0 in Accession #:    [id number]    Weight:       195.1 lb Date of Birth:  Oct 10, 1960     BSA:          2.001 m Patient Age:    61 years      BP:           180/85 mmHg Patient Gender: M             HR:           122 bpm. Exam Location:  Inpatient Procedure: 2D Echo, Color Doppler and Cardiac Doppler STAT ECHO Indications:    Fever  History:        Patient has no prior history of Echocardiogram examinations.                 Arrythmias:Tachycardia;  Signs/Symptoms:Fever.  Sonographer:    Milbert Coulter Referring Phys: [id number] MATTHEW J TRIFAN  Sonographer Comments: Suboptimal apical window. Image acquisition challenging due to respiratory motion and tachypnea. IMPRESSIONS  1. Left ventricular ejection fraction, by estimation, is 60 to 65%. The left ventricle has normal function. The left ventricle has no regional wall motion abnormalities. Left ventricular diastolic parameters are consistent with Grade I diastolic dysfunction (impaired relaxation).  2. Mild D-shape to interventricular septum suggests a degree of RV pressure/volume overload. Right ventricular systolic function is normal. The right ventricular size is mildly enlarged. Tricuspid regurgitation signal is inadequate for assessing PA pressure.  3. The mitral valve is normal in structure. No evidence of mitral valve regurgitation. No evidence of mitral stenosis.  4. The aortic valve is tricuspid. Aortic valve regurgitation is not visualized. No aortic stenosis is present.  5. The inferior vena cava is normal in size with <50% respiratory variability, suggesting right atrial pressure of 8 mmHg.  6. No definite valvular vegetations but technically difficult images. FINDINGS  Left Ventricle: Left ventricular ejection fraction, by estimation, is 60 to 65%. The left ventricle has normal function. The left ventricle has no regional wall motion abnormalities. The left ventricular internal cavity size was normal in size. There is  no left ventricular hypertrophy.  Left ventricular diastolic parameters are consistent with Grade I diastolic dysfunction (impaired relaxation). Right Ventricle: Mild D-shape to interventricular septum suggests a degree of RV pressure/volume overload. The right ventricular size is mildly enlarged. No increase in right ventricular wall thickness. Right ventricular systolic function is normal. Tricuspid regurgitation signal is inadequate for assessing PA pressure. Left Atrium: Left atrial size was normal in size. Right Atrium: Right atrial size was normal in size. Pericardium: Trivial pericardial effusion is present. Mitral Valve: The mitral valve is normal in structure. No evidence of mitral valve regurgitation. No evidence of mitral valve stenosis. Tricuspid Valve: The tricuspid valve is normal in structure. Tricuspid valve regurgitation is trivial. Aortic Valve: The aortic valve is tricuspid. Aortic valve regurgitation is not visualized. No aortic stenosis is present. Aortic valve mean gradient measures 5.0 mmHg. Aortic valve peak gradient measures 8.5 mmHg. Aortic valve area, by VTI measures 1.33 cm. Pulmonic Valve: The pulmonic valve was normal in structure. Pulmonic valve regurgitation is not visualized. Aorta: The aortic root is normal in size and structure. Venous: The inferior vena cava is normal in size with less than 50% respiratory variability, suggesting right atrial pressure of 8 mmHg. IAS/Shunts: No atrial level shunt detected by color flow Doppler.  LEFT VENTRICLE PLAX 2D LVIDd:         3.60 cm   Diastology LVIDs:         1.70 cm   LV e' medial:    7.15 cm/s LV PW:         1.50 cm   LV E/e' medial:  11.0 LV IVS:        1.40 cm   LV e' lateral:   12.90 cm/s LVOT diam:     1.80 cm   LV E/e' lateral: 6.1 LV SV:         27 LV SV Index:   14 LVOT Area:     2.54 cm  RIGHT VENTRICLE RV S prime:     30.30 cm/s TAPSE (M-mode): 2.0 cm LEFT ATRIUM         Index LA diam:    2.50 cm 1.25 cm/m  AORTIC VALVE AV Area (Vmax):    1.53 cm AV  Area  (Vmean):   1.42 cm AV Area (VTI):     1.33 cm AV Vmax:           146.00 cm/s AV Vmean:          105.000 cm/s AV VTI:            0.205 m AV Peak Grad:      8.5 mmHg AV Mean Grad:      5.0 mmHg LVOT Vmax:         87.50 cm/s LVOT Vmean:        58.600 cm/s LVOT VTI:          0.107 m LVOT/AV VTI ratio: 0.52  AORTA Ao Root diam: 3.10 cm Ao Asc diam:  2.70 cm MITRAL VALVE MV Area (PHT): 4.65 cm     SHUNTS MV Decel Time: 163 msec     Systemic VTI:  0.11 m MV E velocity: 78.80 cm/s   Systemic Diam: 1.80 cm MV A velocity: 108.00 cm/s MV E/A ratio:  0.73 Dalton McleanMD Electronically signed by Wilfred Lacy Signature Date/Time: 12/15/2022/12:00:43 PM    Final    DG CHEST PORT 1 VIEW  Result Date: 12/15/2022 CLINICAL DATA:  Fever and sepsis EXAM: PORTABLE CHEST 1 VIEW COMPARISON:  Chest radiograph dated 12/15/2022 FINDINGS: Low lung volumes. Increased bibasilar patchy opacities. Similar blunting of the bilateral costophrenic angles. No pneumothorax. Similar mildly enlarged cardiomediastinal silhouette. No acute osseous abnormality. IMPRESSION: Low lung volumes with increased bibasilar patchy opacities, which may represent atelectasis, aspiration, or pneumonia. Electronically Signed   By: Agustin Cree M.D.   On: 12/15/2022 11:10   CT ABDOMEN PELVIS W CONTRAST  Result Date: 12/15/2022 CLINICAL DATA:  Probability of pulmonary embolism. Acute generalized abdominal pain. EXAM: CT ANGIOGRAPHY CHEST CT ABDOMEN AND PELVIS WITH CONTRAST TECHNIQUE: Multidetector CT imaging of the chest was performed using the standard protocol during bolus administration of intravenous contrast. Multiplanar CT image reconstructions and MIPs were obtained to evaluate the vascular anatomy. Multidetector CT imaging of the abdomen and pelvis was performed using the standard protocol during bolus administration of intravenous contrast. RADIATION DOSE REDUCTION: This exam was performed according to the departmental dose-optimization program which  includes automated exposure control, adjustment of the mA and/or kV according to patient size and/or use of iterative reconstruction technique. CONTRAST:  75mL OMNIPAQUE IOHEXOL 350 MG/ML SOLN COMPARISON:  None Available. FINDINGS: CTA CHEST FINDINGS Cardiovascular: Satisfactory opacification of the pulmonary arteries to the segmental level. No evidence of pulmonary embolism. Mild cardiomegaly. No pericardial effusion. Mediastinum/Nodes: No enlarged mediastinal, hilar, or axillary lymph nodes. Thyroid gland, trachea, and esophagus demonstrate no significant findings. Lungs/Pleura: No pneumothorax or pleural effusion is noted. Mild bibasilar subsegmental atelectasis is noted. 3 mm subpleural nodule is noted laterally in left upper lobe best seen on image number 45 of series 7. Musculoskeletal: No chest wall abnormality. No acute or significant osseous findings. Review of the MIP images confirms the above findings. CT ABDOMEN and PELVIS FINDINGS Hepatobiliary: No focal liver abnormality is seen. No gallstones, gallbladder wall thickening, or biliary dilatation. Pancreas: Unremarkable. No pancreatic ductal dilatation or surrounding inflammatory changes. Spleen: Normal in size without focal abnormality. Adrenals/Urinary Tract: Adrenal glands are unremarkable. Kidneys are normal, without renal calculi, focal lesion, or hydronephrosis. Bladder is unremarkable. Stomach/Bowel: Stomach is within normal limits. Appendix appears normal. No evidence of bowel wall thickening, distention, or inflammatory changes. Vascular/Lymphatic: No significant vascular findings are present. No enlarged abdominal or pelvic lymph nodes. Reproductive: Mild prostatic enlargement. Other:  Large left inguinal hernia is noted which contains a loop of sigmoid colon, but does not result in obstruction. Musculoskeletal: No acute or significant osseous findings. Review of the MIP images confirms the above findings. IMPRESSION: No definite evidence of  pulmonary embolus. Mild bibasilar subsegmental atelectasis. 3 mm subpleural nodule seen in left upper lobe. Although likely benign, if the patient is high-risk, given the morphology and/or location of this nodule a non-contrast chest CT can be considered in 12 months.This recommendation follows the consensus statement: Guidelines for Management of Incidental Pulmonary Nodules Detected on CT Images: From the Fleischner Society 2017; Radiology 2017; 284:228-243. Large left inguinal hernia which contains a loop of sigmoid colon, but does not result in obstruction. Mild prostatic enlargement. Electronically Signed   By: Lupita Raider M.D.   On: 12/15/2022 08:25   CT Angio Chest PE W and/or Wo Contrast  Result Date: 12/15/2022 CLINICAL DATA:  Probability of pulmonary embolism. Acute generalized abdominal pain. EXAM: CT ANGIOGRAPHY CHEST CT ABDOMEN AND PELVIS WITH CONTRAST TECHNIQUE: Multidetector CT imaging of the chest was performed using the standard protocol during bolus administration of intravenous contrast. Multiplanar CT image reconstructions and MIPs were obtained to evaluate the vascular anatomy. Multidetector CT imaging of the abdomen and pelvis was performed using the standard protocol during bolus administration of intravenous contrast. RADIATION DOSE REDUCTION: This exam was performed according to the departmental dose-optimization program which includes automated exposure control, adjustment of the mA and/or kV according to patient size and/or use of iterative reconstruction technique. CONTRAST:  75mL OMNIPAQUE IOHEXOL 350 MG/ML SOLN COMPARISON:  None Available. FINDINGS: CTA CHEST FINDINGS Cardiovascular: Satisfactory opacification of the pulmonary arteries to the segmental level. No evidence of pulmonary embolism. Mild cardiomegaly. No pericardial effusion. Mediastinum/Nodes: No enlarged mediastinal, hilar, or axillary lymph nodes. Thyroid gland, trachea, and esophagus demonstrate no significant  findings. Lungs/Pleura: No pneumothorax or pleural effusion is noted. Mild bibasilar subsegmental atelectasis is noted. 3 mm subpleural nodule is noted laterally in left upper lobe best seen on image number 45 of series 7. Musculoskeletal: No chest wall abnormality. No acute or significant osseous findings. Review of the MIP images confirms the above findings. CT ABDOMEN and PELVIS FINDINGS Hepatobiliary: No focal liver abnormality is seen. No gallstones, gallbladder wall thickening, or biliary dilatation. Pancreas: Unremarkable. No pancreatic ductal dilatation or surrounding inflammatory changes. Spleen: Normal in size without focal abnormality. Adrenals/Urinary Tract: Adrenal glands are unremarkable. Kidneys are normal, without renal calculi, focal lesion, or hydronephrosis. Bladder is unremarkable. Stomach/Bowel: Stomach is within normal limits. Appendix appears normal. No evidence of bowel wall thickening, distention, or inflammatory changes. Vascular/Lymphatic: No significant vascular findings are present. No enlarged abdominal or pelvic lymph nodes. Reproductive: Mild prostatic enlargement. Other: Large left inguinal hernia is noted which contains a loop of sigmoid colon, but does not result in obstruction. Musculoskeletal: No acute or significant osseous findings. Review of the MIP images confirms the above findings. IMPRESSION: No definite evidence of pulmonary embolus. Mild bibasilar subsegmental atelectasis. 3 mm subpleural nodule seen in left upper lobe. Although likely benign, if the patient is high-risk, given the morphology and/or location of this nodule a non-contrast chest CT can be considered in 12 months.This recommendation follows the consensus statement: Guidelines for Management of Incidental Pulmonary Nodules Detected on CT Images: From the Fleischner Society 2017; Radiology 2017; 284:228-243. Large left inguinal hernia which contains a loop of sigmoid colon, but does not result in obstruction.  Mild prostatic enlargement. Electronically Signed   By: Roque Lias  Jr M.D.   On: 12/15/2022 08:25   DG Chest Port 1 View  Result Date: 12/15/2022 CLINICAL DATA:  Questionable sepsis EXAM: PORTABLE CHEST 1 VIEW COMPARISON:  03/06/2021 FINDINGS: Cardiomegaly accentuated by low lung volumes. Mild atelectasis at the lung bases. There is no edema, consolidation, effusion, or pneumothorax. Artifact from EKG leads. IMPRESSION: Low volume chest with mild atelectasis.  No convincing pneumonia. Electronically Signed   By: Tiburcio Pea M.D.   On: 12/15/2022 06:55     Discharge Instructions: Discharge Instructions     Diet - low sodium heart healthy   Complete by: As directed    Increase activity slowly   Complete by: As directed       You were hospitalized and found to have a bacteria in your blood stream called streptococcus pneumoniae. You were treated with IV antibiotics and your blood cultures no longer showed growth of this bacteria. You will be discharged home with 7 more days of antibiotics. We have set you up for a hospital follow up appointment in our clinic. At home:  Take Augmentin 875-125mg  twice daily for 7 days starting 12/18/2022  It was a pleasure caring for you!.  SignedWillette Cluster, MD 12/17/2022, 12:43 PM   Pager: Willette Cluster, MD Internal Medicine Resident, PGY-1 [facility]  Pager: [phone]

## 2022-12-17 NOTE — Evaluation (Signed)
Physical Therapy Evaluation Patient Details Name: Jake Fisher MRN: [id number] DOB: 02/03/1961 Today's Date: 12/17/2022  History of Present Illness  Pt is 62 year old presented to [facility] on  12/15/22 for SIRS likely secondary to streptococcus pneumoniae bacteremia of unknown source. PMH - HTN  Clinical Impression  Pt presents to PT with slightly unsteady gait due to illness and inactivity. Expect pt will make good progress back to baseline with mobility. Will follow acutely but doubt pt will need PT after DC. From PT standpoint can return home when medically ready.        Recommendations for follow up therapy are one component of a multi-disciplinary discharge planning process, led by the attending physician.  Recommendations may be updated based on patient status, additional functional criteria and insurance authorization.  Follow Up Recommendations       Assistance Recommended at Discharge PRN  Patient can return home with the following       Equipment Recommendations None recommended by PT  Recommendations for Other Services       Functional Status Assessment Patient has had a recent decline in their functional status and demonstrates the ability to make significant improvements in function in a reasonable and predictable amount of time.     Precautions / Restrictions Precautions Precautions: None      Mobility  Bed Mobility Overal bed mobility: Modified Independent                  Transfers Overall transfer level: Modified independent Equipment used: None                    Ambulation/Gait Ambulation/Gait assistance: Min guard, Supervision Gait Distance (Feet): 230 Feet Assistive device: None Gait Pattern/deviations: Step-through pattern, Decreased stride length, Wide base of support Gait velocity: decr Gait velocity interpretation: 1.31 - 2.62 ft/sec, indicative of limited community ambulator   General Gait Details: Initial min guard for safety  and progressed to supervision  Stairs            Wheelchair Mobility    Modified Rankin (Stroke Patients Only)       Balance Overall balance assessment: Mild deficits observed, not formally tested                                           Pertinent Vitals/Pain Pain Assessment Pain Assessment: 0-10 Pain Score: 4  Pain Location: abd Pain Descriptors / Indicators: Tightness Pain Intervention(s): Limited activity within patient's tolerance, Monitored during session, Repositioned    Home Living Family/patient expects to be discharged to:: Private residence Living Arrangements: Other relatives (niece) Available Help at Discharge: Family;Available PRN/intermittently Type of Home: House Home Access: Level entry       Home Layout: Two level;Able to live on main level with bedroom/bathroom Home Equipment: None      Prior Function Prior Level of Function : Independent/Modified Independent;Working/employed                     Hand Dominance        Extremity/Trunk Assessment   Upper Extremity Assessment Upper Extremity Assessment: Defer to OT evaluation    Lower Extremity Assessment Lower Extremity Assessment: Generalized weakness       Communication   Communication: No difficulties  Cognition Arousal/Alertness: Awake/alert Behavior During Therapy: WFL for tasks assessed/performed Overall Cognitive Status: Within Functional Limits for tasks assessed  General Comments General comments (skin integrity, edema, etc.): HR 110's with activity. SpO2 98% at rest on RA, 90% with amb on RA. Wheezing    Exercises     Assessment/Plan    PT Assessment Patient needs continued PT services  PT Problem List Decreased mobility       PT Treatment Interventions Gait training;Therapeutic activities;Functional mobility training;Therapeutic exercise;Patient/family education    PT Goals  (Current goals can be found in the Care Plan section)  Acute Rehab PT Goals Patient Stated Goal: return home and to work PT Goal Formulation: With patient Time For Goal Achievement: 12/24/22 Potential to Achieve Goals: Good    Frequency Min 1X/week     Co-evaluation               AM-PAC PT "6 Clicks" Mobility  Outcome Measure Help needed turning from your back to your side while in a flat bed without using bedrails?: None Help needed moving from lying on your back to sitting on the side of a flat bed without using bedrails?: None Help needed moving to and from a bed to a chair (including a wheelchair)?: None Help needed standing up from a chair using your arms (e.g., wheelchair or bedside chair)?: None Help needed to walk in hospital room?: A Little Help needed climbing 3-5 steps with a railing? : A Little 6 Click Score: 22    End of Session   Activity Tolerance: Patient tolerated treatment well Patient left: in chair;with call bell/phone within reach Nurse Communication: Mobility status;Other (comment) (left O2 off) PT Visit Diagnosis: Other abnormalities of gait and mobility (R26.89)    Time: 4098-1191 PT Time Calculation (min) (ACUTE ONLY): 19 min   Charges:   PT Evaluation $PT Eval Low Complexity: 1 Low          [facility] PT Acute Rehabilitation Services Office [phone]   Angelina Ok [facility] 12/17/2022, 8:18 AM

## 2022-12-17 NOTE — Plan of Care (Signed)

## 2022-12-17 NOTE — Progress Notes (Signed)
   Subjective:  Jake Fisher is a 62 yo person living with hypertension admitted with systemic inflammatory response most likely secondary to streptococcus pneumoniae bacteremia.  Currently, the patient feeling better than yesterday, still having some abdominal pain and no bowel movements but pain is overall improving. No sob with ambulation, just feeling a little tired. Has an intermittent, non-productive cough now, no worse between laying and sitting. Notes that he has been sweating a lot. No fever, chills.   Patient states that he has a PCP through his job, who helps treat his blood pressure.   Interval Events: - None    Objective:  Vital Signs:   Temp:  98.3 F (36.8 C)  Pulse Rate:  [73-112] 73  Resp:  [16-20] 16  BP: (119-159)/(65-99) 119/80  SpO2:  [94 %-99 %] 99% on [place]   Last BM Date : 12/14/22  Physical Exam: General: Vital signs reviewed and noted. Well-developed, well-nourished, in no acute distress; alert, appropriate and cooperative throughout examination.  Lungs:  Normal respiratory effort on 1L [place]. Wheezing, bilaterally.  Heart: RRR. S1 and S2 normal without gallop, murmur, or rubs.  Abdomen:  Soft, non-distended, normoactive BS.     Labs:  Repeat blood culture: no growth <24 hours   Basic Metabolic Panel:   Lab 12/15/22 0625 12/16/22 0734 12/17/22 0153  NA 134* 137 132*  K 4.3 3.7 3.6  CL 100 103 102  CO2 23 23 24   GLUCOSE 153* 127* 158*  BUN 8 20 17   CREATININE 1.08 0.99 0.86  CALCIUM 9.3 8.8* 8.7*    CBC:   Lab 12/15/22 0625 12/16/22 0734 12/17/22 0153  WBC 13.1* 24.5* 17.9*  NEUTROABS 11.7*  --   --   HGB 12.9* 11.8* 11.7*  HCT 38.0* 36.7* 34.9*  MCV 90.9 93.9 91.1  PLT 190 164 154     Assessment & Plan:  Jake Fisher is 62 yo person living with hypertension admitted 5/8 with concern for systemic inflammatory response secondary to strep pneumoniae bacteremia.  #Bacteremia (S. Pneumo) Patient's febrile illness on presentation is  most likely secondary to streptococcus pneumoniae bacteremia. Has improved with IV antibiotics. Vitals stable, afebrile, improved SOB. WBC has also decreased (13.1>24.5>17.9). Of note, repeat blood cultures demonstrate no growth at <24 hours. Original culture susceptibilities pending. Can likely discharge today pending susceptibilities report. Will need PT/OT evaluation to assess mobility/function while admitted as well as close outpatient follow-up with [facility].  - Transition IV ceftriaxone 2g daily to Augmentin 875/125 mg PO daily (day 3, 10 day course),   - Follow-up blood culture susceptibilities  - Ipratropium inhaler 1 puff q4h PRN - PT/OT   #Hypertension  Patient's blood pressure has improved with resumption of home medication. - Continue home losartan 50 mg daily    #Subpleural nodule CT Angio Chest (PE) on admission demonstrated a 3 mm subpleural nodule in left upper lobe.  - Repeat CT in 12 months.    Length of Stay: 2 day(s)  Signed:  Lyda Kalata, Medical Student 12/17/2022, 5:58 AM

## 2022-12-18 LAB — CULTURE, BLOOD (SINGLE): Special Requests: ADEQUATE

## 2022-12-19 LAB — CULTURE, BLOOD (SINGLE): Culture: NO GROWTH

## 2022-12-24 ENCOUNTER — Ambulatory Visit (INDEPENDENT_AMBULATORY_CARE_PROVIDER_SITE_OTHER): Payer: BC Managed Care – PPO | Admitting: Student

## 2022-12-24 ENCOUNTER — Encounter: Payer: Self-pay | Admitting: Student

## 2022-12-24 VITALS — BP 153/96 | HR 112

## 2022-12-24 DIAGNOSIS — I10 Essential (primary) hypertension: Secondary | ICD-10-CM

## 2022-12-24 DIAGNOSIS — R21 Rash and other nonspecific skin eruption: Secondary | ICD-10-CM

## 2022-12-24 DIAGNOSIS — D649 Anemia, unspecified: Secondary | ICD-10-CM | POA: Diagnosis not present

## 2022-12-24 DIAGNOSIS — R0602 Shortness of breath: Secondary | ICD-10-CM | POA: Insufficient documentation

## 2022-12-24 DIAGNOSIS — D500 Iron deficiency anemia secondary to blood loss (chronic): Secondary | ICD-10-CM | POA: Diagnosis not present

## 2022-12-24 DIAGNOSIS — Z87898 Personal history of other specified conditions: Secondary | ICD-10-CM

## 2022-12-24 DIAGNOSIS — R0789 Other chest pain: Secondary | ICD-10-CM | POA: Diagnosis not present

## 2022-12-24 DIAGNOSIS — E871 Hypo-osmolality and hyponatremia: Secondary | ICD-10-CM | POA: Insufficient documentation

## 2022-12-24 DIAGNOSIS — R222 Localized swelling, mass and lump, trunk: Secondary | ICD-10-CM

## 2022-12-24 DIAGNOSIS — R600 Localized edema: Secondary | ICD-10-CM | POA: Insufficient documentation

## 2022-12-24 NOTE — Assessment & Plan Note (Addendum)
Patient likely had allergic reaction to Augmentin.  On exam, I noticed hives on patient's torso throughout as well as lower extremities.  Plan: -Instructed patient to take Benadryl -Stop Augmentin and put on allergy list -Return in 1 week to follow up rash

## 2022-12-24 NOTE — Assessment & Plan Note (Addendum)
Patient has a past medical history of Streptococcus pneumoniae bacteremia.  Patient was recently hospitalized on 12/15/2022 to 12/17/2022.  Patient initially presented with shortness of breath, chest pain in which at first it was thought that patient had potential PE, but it was ruled out via CTA chest during ED course.  Patient was found to be septic, and blood cultures grew Streptococcus pneumoniae.  Patient was narrowed to ceftriaxone during hospital stay, and on discharge transition to Augmentin.  Repeat blood cultures are negative.  Patient also had echo during hospital stay which did not show any concern for vegetations.  Patient did complete Augmentin therapy, but did have allergic reaction to Augmentin with diffuse rash and hives noted to torso and lower extremities on my exam today.  Do recommend patient to take Benadryl and stop Augmentin, but plan was for Augmentin course done today anyway.  Plan: -Stop Augmentin -Plan for Benadryl -Given concern for dental etiology of bacteremia plan for patient to see a dentist, and patient encouraged to call insurance to see which dentist are covered under his plan

## 2022-12-24 NOTE — Assessment & Plan Note (Signed)
Patient endorses having right lower quadrant pain which has improved since hospitalization as it went from constant to intermittent now.  He states this is a pain that gets worse with movements, shortness of breath, cough, or getting up.  He states it is in his right lower quadrant and it radiates up to his right ribs.  He denies any association with nausea or vomiting.  He denies any changes in his bowel or bladder habits.  He states that passing flatus and having bowel meant helps the pain.  On exam, patient does not have any tenderness noted to his abdomen, but does have tenderness noted to rib angle of ribs 6-7.  No bruising noted.  Unclear what this could be, but do not think this is from abdominal etiology.  Patient recently had CT which was not revealing.  Unclear etiology at this time.  Will continue to watch and see if this improves with time.  Plan: -Supportive care -Continue to monitor -Encourage patient to get lidocaine patches, use Tylenol, or naproxen

## 2022-12-24 NOTE — Patient Instructions (Addendum)
De Nurse Coldren,Thank you for allowing me to take part in your care today.  Here are your instructions.  1.  Regarding your swollen legs, I want you to elevate your legs, and use compression.  Try to move is much as you can.  2.  Regarding your rash, I want you to stop taking your antibiotic, as I think this is what is contributing to your rash.  Please pick up Benadryl from the pharmacy which can be found over-the-counter.  3.  Regarding your pain, I want you to keep an eye on this, you can use lidocaine patches, ibuprofen, Tylenol, or naproxen for now.  Please follow-up in 1 week as we can evaluate this again.  4.  For your shortness of breath, I want you to do pulmonary function test.  Please wait for call to schedule.  5.  Regarding your electrolytes, kidney function, blood counts, I want to check your labs today.  Please await my phone call for results.  6.  Please make an appointment with your primary care physician to follow-up with for medical leave from work.  7. Please look into your network to see what dentist is covered by your insurance and see who you can go to because this is very important.  8 If you develop fevers, shortness of breath that does not improve, pain out of proportion, please go to the emergency department to be evaluated.  Thank you, Dr. Allena Katz  If you have any other questions please contact the internal medicine clinic at [phone]

## 2022-12-24 NOTE — Assessment & Plan Note (Addendum)
Patient has a past medical history of hypertension treated with losartan 50 mg daily.  Patient presents to the clinic today with elevated pressures into the 140s and 150s systolics.  Patient did take his medication this morning.  As this is the first time I am meeting the patient, the patient had many things going on, recommended patient check blood pressure at home, take log to PCP.  Plan: -Continue losartan 50 mg daily

## 2022-12-24 NOTE — Assessment & Plan Note (Signed)
Patient reports having shortness of breath.  He also reports wheezing.  He states this has been going on since he got hospitalized.  During hospital stay, patient did not have etiology that could explain this.  Patient is not a smoker.  He denies any history of asthma.  His brother in the room states that the patient was sick a lot when he was younger, and thinks he may have had asthma but he is not sure.  Patient states he is not short of breath at rest, but when he ambulates.  On exam, patient does have coarse breath sounds bilaterally at lung bases, but no wheezing appreciated but patient can move air well in the upper airways.  Patient does work at a [employer], [employer], and I feel this could be contributing to a hypersensitivity pneumonitis.  Patient could also have underlying asthma that could be flaring up at times.  Unclear if patient has interstitial lung disease as most recent CT did not comment on this.  I think patient could benefit from pulmonary function test.  Plan: -PFTs pending

## 2022-12-24 NOTE — Progress Notes (Signed)
CC: Hospital follow-up  HPI:  Jake Fisher is a 62 y.o. male with past medical history of strep pneumo bacteremia, hypertension, chronic normocytic anemia who presents for new hospital follow-up.  Patient was recently hospitalized from 12/15/2022 to 12/17/2022.  Patient was treated with ceftriaxone.  Patient had repeat cultures with no growth.  Patient had TTE with no abnormalities.  Patient was then transition to Augmentin for 10-day course ending on 05/17.  Please see assessment and plan for full HPI.  Patient does state that he does not want to establish care here, and is only here for hospital follow-up.  History: Medical: HTN, HLD, normocytic anemia Meds: Vit D supplement,  Surgical: no surgeris  Family: None Allergies: no drug allergies  Social: lives with sister, safe at home, good support, no tob use, alc in the past no more now, no drug use, [place] at [employer].   History of bacteremia: Augmentin 875-125 mg twice daily Hypertension: Losartan 50 mg daily Hyperlipidemia: Rosuvastatin 20 mg daily  Most recent labs: 12/17/2022: BMP sodium 132, potassium 3.6, creatinine 0.86 CBC showed white count 17.9, hemoglobin 11.7, hematocrit 34.9, MCV 91, platelets 154 12/16/2022 blood cultures showing no growth for 5 days  Past Medical History:  Diagnosis Date   HLD (hyperlipidemia)    HTN (hypertension)      Current Outpatient Medications:    Cholecalciferol (VITAMIN D-3 PO), Take 1 capsule by mouth daily., Disp: , Rfl:    losartan (COZAAR) 50 MG tablet, Take 50 mg by mouth daily., Disp: , Rfl:    rosuvastatin (CRESTOR) 20 MG tablet, Take 20 mg by mouth daily., Disp: , Rfl:   Review of Systems:    Respiratory: Patient endorses shortness of breath GI: Patient endorses right-sided abdominal pain Skin: Patient endorses rash  Physical Exam:  Vitals:   12/24/22 1056 12/24/22 1148  BP: (!) 146/85 (!) 153/96  Pulse: (!) 113 (!) 112  SpO2: 95%     General:  Patient is sitting comfortably in the room  Head: Normocephalic, atraumatic  Cardio: Regular rate and rhythm, no murmurs, rubs or gallops. 2+ pulses to bilateral upper and lower extremities  Chest: Right rib angle 6-7 with tenderness to palpation Pulmonary: C coarse breath sounds to bilateral lower lung bases, no wheezes, rhonchi, or rales appreciated Abdomen: Soft, nontender with normoactive bowel sounds with no rebound or guarding Skin: Erythematous hives noted to entire torso  Assessment & Plan:   HTN (hypertension) Patient has a past medical history of hypertension treated with losartan 50 mg daily.  Patient presents to the clinic today with elevated pressures into the 140s and 150s systolics.  Patient did take his medication this morning.  As this is the first time I am meeting the patient, the patient had many things going on, recommended patient check blood pressure at home, take log to PCP.  Plan: -Continue losartan 50 mg daily  Normocytic anemia Patient has a past medical history of normocytic anemia.  While in the hospital, patient had blood work showing signs.  Will obtain CBC outpatient as patient was acutely ill during inpatient stay.  Plan: -CBC pending -If still normocytic anemia present, plan to obtain B12, folate, iron studies  History of bacteremia Patient has a past medical history of Streptococcus pneumoniae bacteremia.  Patient was recently hospitalized on 12/15/2022 to 12/17/2022.  Patient initially presented with shortness of breath, chest pain in which at first it was thought that patient had potential PE, but it was ruled out  via CTA chest during ED course.  Patient was found to be septic, and blood cultures grew Streptococcus pneumoniae.  Patient was narrowed to ceftriaxone during hospital stay, and on discharge transition to Augmentin.  Repeat blood cultures are negative.  Patient also had echo during hospital stay which did not show any concern for vegetations.   Patient did complete Augmentin therapy, but did have allergic reaction to Augmentin with diffuse rash and hives noted to torso and lower extremities on my exam today.  Do recommend patient to take Benadryl and stop Augmentin, but plan was for Augmentin course done today anyway.  Plan: -Stop Augmentin -Plan for Benadryl -Given concern for dental etiology of bacteremia plan for patient to see a dentist, and patient encouraged to call insurance to see which dentist are covered under his plan  Right-sided chest wall pain Patient endorses having right lower quadrant pain which has improved since hospitalization as it went from constant to intermittent now.  He states this is a pain that gets worse with movements, shortness of breath, cough, or getting up.  He states it is in his right lower quadrant and it radiates up to his right ribs.  He denies any association with nausea or vomiting.  He denies any changes in his bowel or bladder habits.  He states that passing flatus and having bowel meant helps the pain.  On exam, patient does not have any tenderness noted to his abdomen, but does have tenderness noted to rib angle of ribs 6-7.  No bruising noted.  Unclear what this could be, but do not think this is from abdominal etiology.  Patient recently had CT which was not revealing.  Unclear etiology at this time.  Will continue to watch and see if this improves with time.  Plan: -Supportive care -Continue to monitor -Encourage patient to get lidocaine patches, use Tylenol, or naproxen  Shortness of breath Patient reports having shortness of breath.  He also reports wheezing.  He states this has been going on since he got hospitalized.  During hospital stay, patient did not have etiology that could explain this.  Patient is not a smoker.  He denies any history of asthma.  His brother in the room states that the patient was sick a lot when he was younger, and thinks he may have had asthma but he is not sure.   Patient states he is not short of breath at rest, but when he ambulates.  On exam, patient does have coarse breath sounds bilaterally at lung bases, but no wheezing appreciated but patient can move air well in the upper airways.  Patient does work at a [employer], [employer], and I feel this could be contributing to a hypersensitivity pneumonitis.  Patient could also have underlying asthma that could be flaring up at times.  Unclear if patient has interstitial lung disease as most recent CT did not comment on this.  I think patient could benefit from pulmonary function test.  Plan: -PFTs pending  Bilateral lower extremity edema Patient endorses 30-day history of lower extremity edema.  Patient reports that this came out of nowhere.  Patient denies any pain.  He states he just noticed his lower extremities became more swollen.  He denies any redness that he appreciated.  On exam, patient does have 1+ pitting edema noted bilaterally.  I do not see any weeping.  Etiology likely due to immobilization given patient has moved less after being hospitalized.  Daily labs, I am not concerned about any  nephropathy or any liver etiology.  I do not think this is heart failure, especially that his most recent echo does not show any reduced ejection fraction.  Plan will be for elevating legs and compression. I also encouraged patient to mobilize more as this can help.   Plan: -Compression, elevation, mobilization   Hyponatremia Most recent labs showing patient hyponatremic at 132.   Plan: -Repeat BMP today   Pleural nodule Imaging in May 2024 showing subpleural nodule found incidentally on exam.  Plan: -Repeat CT imaging in 12 months, plan to CT in May 2025  Rash Patient likely had allergic reaction to Augmentin.  On exam, I noticed hives on patient's torso throughout as well as lower extremities.  Plan: -Instructed patient to take Benadryl -Stop Augmentin and put on allergy list -Return in 1 week to follow up  rash   Patient discussed with Dr.  Mercie Eon  Modena Slater, DO PGY-1 Internal Medicine Resident  Pager: [phone]

## 2022-12-24 NOTE — Assessment & Plan Note (Signed)
Most recent labs showing patient hyponatremic at 132.   Plan: -Repeat BMP today   Addendum: -Na back to normal on repeat BMP

## 2022-12-24 NOTE — Assessment & Plan Note (Addendum)
Patient has a past medical history of normocytic anemia.  While in the hospital, patient had blood work showing signs.  Will obtain CBC outpatient as patient was acutely ill during inpatient stay.  Plan: -CBC pending -If still normocytic anemia present, plan to obtain B12, folate, iron studies  Addendu,: -CBC showing normocytic anemia, plan to get iron studies and vitamin B12 and folate levels at next visit.

## 2022-12-24 NOTE — Assessment & Plan Note (Signed)
Patient endorses 30-day history of lower extremity edema.  Patient reports that this came out of nowhere.  Patient denies any pain.  He states he just noticed his lower extremities became more swollen.  He denies any redness that he appreciated.  On exam, patient does have 1+ pitting edema noted bilaterally.  I do not see any weeping.  Etiology likely due to immobilization given patient has moved less after being hospitalized.  Daily labs, I am not concerned about any nephropathy or any liver etiology.  I do not think this is heart failure, especially that his most recent echo does not show any reduced ejection fraction.  Plan will be for elevating legs and compression. I also encouraged patient to mobilize more as this can help.   Plan: -Compression, elevation, mobilization

## 2022-12-24 NOTE — Assessment & Plan Note (Signed)
Imaging in May 2024 showing subpleural nodule found incidentally on exam.  Plan: -Repeat CT imaging in 12 months, plan to CT in May 2025

## 2022-12-25 LAB — CBC WITH DIFFERENTIAL/PLATELET
Basophils Absolute: 0 10*3/uL (ref 0.0–0.2)
Basos: 0 %
EOS (ABSOLUTE): 0.3 10*3/uL (ref 0.0–0.4)
Eos: 3 %
Hematocrit: 33 % — ABNORMAL LOW (ref 37.5–51.0)
Hemoglobin: 10.6 g/dL — ABNORMAL LOW (ref 13.0–17.7)
Immature Grans (Abs): 0.2 10*3/uL — ABNORMAL HIGH (ref 0.0–0.1)
Immature Granulocytes: 2 %
Lymphocytes Absolute: 1.8 10*3/uL (ref 0.7–3.1)
Lymphs: 17 %
MCH: 29.3 pg (ref 26.6–33.0)
MCHC: 32.1 g/dL (ref 31.5–35.7)
MCV: 91 fL (ref 79–97)
Monocytes Absolute: 0.7 10*3/uL (ref 0.1–0.9)
Monocytes: 7 %
Neutrophils Absolute: 7.8 10*3/uL — ABNORMAL HIGH (ref 1.4–7.0)
Neutrophils: 71 %
Platelets: 432 10*3/uL (ref 150–450)
RBC: 3.62 x10E6/uL — ABNORMAL LOW (ref 4.14–5.80)
RDW: 12.3 % (ref 11.6–15.4)
WBC: 10.8 10*3/uL (ref 3.4–10.8)

## 2022-12-25 LAB — BMP8+ANION GAP
Anion Gap: 16 mmol/L (ref 10.0–18.0)
BUN/Creatinine Ratio: 14 (ref 10–24)
BUN: 10 mg/dL (ref 8–27)
CO2: 24 mmol/L (ref 20–29)
Calcium: 9 mg/dL (ref 8.6–10.2)
Chloride: 100 mmol/L (ref 96–106)
Creatinine, Ser: 0.74 mg/dL — ABNORMAL LOW (ref 0.76–1.27)
Glucose: 105 mg/dL — ABNORMAL HIGH (ref 70–99)
Potassium: 4.3 mmol/L (ref 3.5–5.2)
Sodium: 140 mmol/L (ref 134–144)
eGFR: 102 mL/min/{1.73_m2} (ref >59)

## 2022-12-27 ENCOUNTER — Encounter: Payer: Self-pay | Admitting: Student

## 2022-12-27 NOTE — Progress Notes (Signed)
Internal Medicine Clinic Attending  Case discussed with Dr. Allena Katz  At the time of the visit.  We reviewed the resident's history and exam and pertinent patient test results.  I agree with the assessment, diagnosis, and plan of care documented in the resident's note.    Our plan was for 1-week follow up for this complex patient who presented for hospital discharge appointment & to establish care. He was planning to establish with [place], but now it looks like he is seeing his PCP in [place] later today. Dr. Allena Katz will confirm who patient's PCP is, and he will need to be seen in the next 1-2 weeks in [facility] if we're his PCP.

## 2023-01-14 ENCOUNTER — Ambulatory Visit (HOSPITAL_COMMUNITY)
Admission: RE | Admit: 2023-01-14 | Discharge: 2023-01-14 | Disposition: A | Discharge status: Home / Self Care | Payer: BC Managed Care – PPO | Source: Ambulatory Visit | Attending: Internal Medicine | Admitting: Internal Medicine

## 2023-01-14 DIAGNOSIS — R0602 Shortness of breath: Secondary | ICD-10-CM | POA: Diagnosis not present

## 2023-01-14 LAB — PULMONARY FUNCTION TEST
DL/VA % pred: 126 %
DL/VA: 5.39 ml/min/mmHg/L
DLCO cor % pred: 75 %
DLCO cor: 18.8 ml/min/mmHg
DLCO unc % pred: 66 %
DLCO unc: 16.56 ml/min/mmHg
FEF 25-75 Post: 1.04 L/sec
FEF 25-75 Pre: 1.01 L/sec
FEF2575-%Change-Post: 3 %
FEF2575-%Pred-Post: 40 %
FEF2575-%Pred-Pre: 38 %
FEV1-%Change-Post: 0 %
FEV1-%Pred-Post: 47 %
FEV1-%Pred-Pre: 47 %
FEV1-Post: 1.52 L
FEV1-Pre: 1.52 L
FEV1FVC-%Change-Post: 0 %
FEV1FVC-%Pred-Pre: 94 %
FEV6-%Change-Post: 1 %
FEV6-%Pred-Post: 53 %
FEV6-%Pred-Pre: 52 %
FEV6-Post: 2.13 L
FEV6-Pre: 2.09 L
FEV6FVC-%Change-Post: 1 %
FEV6FVC-%Pred-Post: 104 %
FEV6FVC-%Pred-Pre: 103 %
FVC-%Change-Post: 0 %
FVC-%Pred-Post: 50 %
FVC-%Pred-Pre: 50 %
FVC-Post: 2.14 L
FVC-Pre: 2.12 L
Post FEV1/FVC ratio: 71 %
Post FEV6/FVC ratio: 100 %
Pre FEV1/FVC ratio: 71 %
Pre FEV6/FVC Ratio: 98 %
RV % pred: 114 %
RV: 2.43 L
TLC % pred: 71 %
TLC: 4.57 L

## 2023-01-14 MED ORDER — ALBUTEROL SULFATE (2.5 MG/3ML) 0.083% IN NEBU
2.5000 mg | INHALATION_SOLUTION | Freq: Once | RESPIRATORY_TRACT | Status: AC
  Administered 2023-01-14: 2.5 mg via RESPIRATORY_TRACT

## 2023-08-22 ENCOUNTER — Encounter (HOSPITAL_COMMUNITY): Payer: Self-pay

## 2023-08-22 ENCOUNTER — Ambulatory Visit (HOSPITAL_COMMUNITY)
Admission: EM | Admit: 2023-08-22 | Discharge: 2023-08-22 | Disposition: A | Discharge status: Home / Self Care | Payer: BC Managed Care – PPO | Attending: Family Medicine | Admitting: Family Medicine

## 2023-08-22 DIAGNOSIS — A084 Viral intestinal infection, unspecified: Secondary | ICD-10-CM | POA: Diagnosis not present

## 2023-08-22 NOTE — Discharge Instructions (Signed)
 Please go to the pharmacy and take over-the-counter Imodium.  Please follow the instructions on the package.  Please continue to take as much fluids as possible, it is best if you mixed water with some Gatorade in order to get some electrolytes.  This should pass eventually.  If you feel like you are not getting any improvement despite these interventions over the next few days please come back and see us .

## 2023-08-22 NOTE — ED Provider Notes (Signed)
 [facility]    CSN: [id number] Arrival date & time: 08/22/23  1028      History   Chief Complaint Chief Complaint  Patient presents with   Diarrhea    HPI Jake Fisher is a 63 y.o. male.   Patient is presenting for a few day history of diarrhea, lower abdominal cramps as well as some fevers and chills.  Patient notes that there is no blood in his stool.  Patient states that he is able to tolerate oral liquids but not solids.  Patient states that he has no nausea or vomiting.  Patient otherwise has no other concerns at this time.   Diarrhea   Past Medical History:  Diagnosis Date   HLD (hyperlipidemia)    HTN (hypertension)     Patient Active Problem List   Diagnosis Date Noted   Right-sided chest wall pain 12/24/2022   Shortness of breath 12/24/2022   Bilateral lower extremity edema 12/24/2022   Hyponatremia 12/24/2022   Pleural nodule 12/24/2022   Rash 12/24/2022   History of bacteremia 12/17/2022   HTN (hypertension) 12/17/2022   Normocytic anemia 12/17/2022   Severe systemic inflammatory response syndrome (SIRS) (HCC) 12/15/2022    History reviewed. No pertinent surgical history.     Home Medications    Prior to Admission medications   Medication Sig Start Date End Date Taking? Authorizing Provider  aspirin EC 81 MG tablet Take by mouth. 05/26/23 05/25/24 Yes [provider]  metFORMIN (GLUCOPHAGE-XR) 500 MG 24 hr tablet Take by mouth. 06/20/23 06/19/24 Yes [provider]  Cholecalciferol (VITAMIN D-3 PO) Take 1 capsule by mouth daily.    [provider]  losartan  (COZAAR ) 50 MG tablet Take 50 mg by mouth daily. 07/26/22   [provider]  metoprolol succinate (TOPROL-XL) 50 MG 24 hr tablet Take 50 mg by mouth daily.    [provider]  naproxen sodium (ALEVE) 220 MG tablet Take by mouth.    [provider]  rosuvastatin (CRESTOR) 20 MG tablet Take 20 mg by mouth daily. 07/26/22 07/26/23   [provider]    Family History Family History  Problem Relation Age of Onset   Diabetes Mother     Social History Social History   Tobacco Use   Smoking status: Never   Smokeless tobacco: Never  Vaping Use   Vaping status: Never Used  Substance Use Topics   Alcohol use: Yes   Drug use: No     Allergies   Augmentin  [amoxicillin -pot clavulanate]   Review of Systems Review of Systems  Gastrointestinal:  Positive for diarrhea.     Physical Exam Triage Vital Signs ED Triage Vitals  Encounter Vitals Group     BP      Systolic BP Percentile      Diastolic BP Percentile      Pulse      Resp      Temp      Temp src      SpO2      Weight      Height      Head Circumference      Peak Flow      Pain Score      Pain Loc      Pain Education      Exclude from Growth Chart    No data found.  Updated Vital Signs There were no vitals taken for this visit.  Visual Acuity Right Eye Distance:   Left Eye Distance:  Bilateral Distance:    Right Eye Near:   Left Eye Near:    Bilateral Near:     Physical Exam Constitutional:      Appearance: Normal appearance.  Abdominal:     General: Abdomen is flat.     Palpations: Abdomen is soft.     Tenderness: There is no abdominal tenderness.  Neurological:     Mental Status: He is alert.      UC Treatments / Results  Labs (all labs ordered are listed, but only abnormal results are displayed) Labs Reviewed - No data to display  EKG   Radiology No results found.  Procedures Procedures (including critical care time)  Medications Ordered in UC Medications - No data to display  Initial Impression / Assessment and Plan / UC Course  I have reviewed the triage vital signs and the nursing notes.  Pertinent labs & imaging results that were available during my care of the patient were reviewed by me and considered in my medical decision making (see chart for details).     Patient likely dealing  with a viral GI bug at this time, did discuss with patient that the best intervention would be able to stay hydrated.  Discussed with patient that the viral illness will eventually pass.  Did advise patient that he can take over-the-counter Imodium for the abdominal cramps and diarrhea.  Advised patient that if he feels like he is not able to tolerate any oral liquids then he should come back and see us /go to the ER for hydration as that was what will be most beneficial for him.  Patient is understanding and agreeable with plan.  Final diagnoses:  Viral gastroenteritis     Discharge Instructions      Please go to the pharmacy and take over-the-counter Imodium.  Please follow the instructions on the package.  Please continue to take as much fluids as possible, it is best if you mixed water with some Gatorade in order to get some electrolytes.  This should pass eventually.  If you feel like you are not getting any improvement despite these interventions over the next few days please come back and see us .     ED Prescriptions   None    PDMP not reviewed this encounter.   Vita Morrow, MD 08/22/23 1215

## 2023-08-22 NOTE — ED Triage Notes (Signed)
 Pt c/o constant diarrhea, lower abdomen pain, sweating and fevers since Friday evening. Denies taking any meds. C/o decreased appetite.

## 2023-10-13 ENCOUNTER — Emergency Department (HOSPITAL_COMMUNITY)
Admission: EM | Admit: 2023-10-13 | Discharge: 2023-10-13 | Disposition: A | Discharge status: Home / Self Care | Attending: Emergency Medicine | Admitting: Emergency Medicine

## 2023-10-13 ENCOUNTER — Emergency Department (HOSPITAL_COMMUNITY)

## 2023-10-13 ENCOUNTER — Other Ambulatory Visit: Payer: Self-pay

## 2023-10-13 DIAGNOSIS — K921 Melena: Secondary | ICD-10-CM | POA: Insufficient documentation

## 2023-10-13 DIAGNOSIS — K659 Peritonitis, unspecified: Secondary | ICD-10-CM | POA: Diagnosis not present

## 2023-10-13 DIAGNOSIS — Z79899 Other long term (current) drug therapy: Secondary | ICD-10-CM | POA: Diagnosis not present

## 2023-10-13 DIAGNOSIS — Z7982 Long term (current) use of aspirin: Secondary | ICD-10-CM | POA: Insufficient documentation

## 2023-10-13 DIAGNOSIS — I10 Essential (primary) hypertension: Secondary | ICD-10-CM | POA: Insufficient documentation

## 2023-10-13 DIAGNOSIS — R1032 Left lower quadrant pain: Secondary | ICD-10-CM | POA: Diagnosis present

## 2023-10-13 DIAGNOSIS — Z7984 Long term (current) use of oral hypoglycemic drugs: Secondary | ICD-10-CM | POA: Diagnosis not present

## 2023-10-13 DIAGNOSIS — K6389 Other specified diseases of intestine: Secondary | ICD-10-CM

## 2023-10-13 DIAGNOSIS — E119 Type 2 diabetes mellitus without complications: Secondary | ICD-10-CM | POA: Diagnosis not present

## 2023-10-13 LAB — CBC WITH DIFFERENTIAL/PLATELET
Abs Immature Granulocytes: 0.03 10*3/uL (ref 0.00–0.07)
Basophils Absolute: 0.1 10*3/uL (ref 0.0–0.1)
Basophils Relative: 1 %
Eosinophils Absolute: 0.2 10*3/uL (ref 0.0–0.5)
Eosinophils Relative: 4 %
HCT: 37.1 % — ABNORMAL LOW (ref 39.0–52.0)
Hemoglobin: 12 g/dL — ABNORMAL LOW (ref 13.0–17.0)
Immature Granulocytes: 1 %
Lymphocytes Relative: 41 %
Lymphs Abs: 2.7 10*3/uL (ref 0.7–4.0)
MCH: 30.5 pg (ref 26.0–34.0)
MCHC: 32.3 g/dL (ref 30.0–36.0)
MCV: 94.2 fL (ref 80.0–100.0)
Monocytes Absolute: 0.5 10*3/uL (ref 0.1–1.0)
Monocytes Relative: 8 %
Neutro Abs: 3 10*3/uL (ref 1.7–7.7)
Neutrophils Relative %: 45 %
Platelets: 235 10*3/uL (ref 150–400)
RBC: 3.94 MIL/uL — ABNORMAL LOW (ref 4.22–5.81)
RDW: 13.4 % (ref 11.5–15.5)
WBC: 6.5 10*3/uL (ref 4.0–10.5)
nRBC: 0 % (ref 0.0–0.2)

## 2023-10-13 LAB — COMPREHENSIVE METABOLIC PANEL
ALT: 16 U/L (ref 0–44)
AST: 20 U/L (ref 15–41)
Albumin: 3.4 g/dL — ABNORMAL LOW (ref 3.5–5.0)
Alkaline Phosphatase: 60 U/L (ref 38–126)
Anion gap: 9 (ref 5–15)
BUN: 11 mg/dL (ref 8–23)
CO2: 25 mmol/L (ref 22–32)
Calcium: 9.2 mg/dL (ref 8.9–10.3)
Chloride: 104 mmol/L (ref 98–111)
Creatinine, Ser: 1.25 mg/dL — ABNORMAL HIGH (ref 0.61–1.24)
GFR, Estimated: >60 mL/min (ref >60)
Glucose, Bld: 116 mg/dL — ABNORMAL HIGH (ref 70–99)
Potassium: 4.2 mmol/L (ref 3.5–5.1)
Sodium: 138 mmol/L (ref 135–145)
Total Bilirubin: 0.8 mg/dL (ref 0.0–1.2)
Total Protein: 7.4 g/dL (ref 6.5–8.1)

## 2023-10-13 LAB — BASIC METABOLIC PANEL
Anion gap: 9 (ref 5–15)
BUN: 11 mg/dL (ref 8–23)
CO2: 24 mmol/L (ref 22–32)
Calcium: 9.4 mg/dL (ref 8.9–10.3)
Chloride: 106 mmol/L (ref 98–111)
Creatinine, Ser: 1.13 mg/dL (ref 0.61–1.24)
GFR, Estimated: >60 mL/min (ref >60)
Glucose, Bld: 123 mg/dL — ABNORMAL HIGH (ref 70–99)
Potassium: 4.5 mmol/L (ref 3.5–5.1)
Sodium: 139 mmol/L (ref 135–145)

## 2023-10-13 LAB — URINALYSIS, ROUTINE W REFLEX MICROSCOPIC
Bilirubin Urine: NEGATIVE
Glucose, UA: NEGATIVE mg/dL
Hgb urine dipstick: NEGATIVE
Ketones, ur: NEGATIVE mg/dL
Leukocytes,Ua: NEGATIVE
Nitrite: NEGATIVE
Protein, ur: NEGATIVE mg/dL
Specific Gravity, Urine: 1.01 (ref 1.005–1.030)
pH: 5 (ref 5.0–8.0)

## 2023-10-13 LAB — LIPASE, BLOOD: Lipase: 20 U/L (ref 11–51)

## 2023-10-13 LAB — RESP PANEL BY RT-PCR (RSV, FLU A&B, COVID)  RVPGX2
Influenza A by PCR: NEGATIVE
Influenza B by PCR: NEGATIVE
Resp Syncytial Virus by PCR: NEGATIVE
SARS Coronavirus 2 by RT PCR: NEGATIVE

## 2023-10-13 LAB — TYPE AND SCREEN
ABO/RH(D): O POS
Antibody Screen: NEGATIVE

## 2023-10-13 LAB — LACTIC ACID, PLASMA: Lactic Acid, Venous: 1 mmol/L (ref 0.5–1.9)

## 2023-10-13 LAB — POC OCCULT BLOOD, ED: Fecal Occult Bld: POSITIVE — AB

## 2023-10-13 MED ORDER — IOHEXOL 350 MG/ML SOLN
75.0000 mL | Freq: Once | INTRAVENOUS | Status: AC | PRN
  Administered 2023-10-13: 75 mL via INTRAVENOUS

## 2023-10-13 MED ORDER — METRONIDAZOLE 500 MG PO TABS
500.0000 mg | ORAL_TABLET | Freq: Once | ORAL | Status: AC
  Administered 2023-10-13: 500 mg via ORAL
  Filled 2023-10-13: qty 1

## 2023-10-13 MED ORDER — CIPROFLOXACIN HCL 500 MG PO TABS: 500.0000 mg | ORAL_TABLET | Freq: Two times a day (BID) | ORAL | 0 refills | Status: AC

## 2023-10-13 MED ORDER — CIPROFLOXACIN HCL 500 MG PO TABS
500.0000 mg | ORAL_TABLET | Freq: Once | ORAL | Status: AC
  Administered 2023-10-13: 500 mg via ORAL
  Filled 2023-10-13: qty 1

## 2023-10-13 MED ORDER — METRONIDAZOLE 500 MG PO TABS: 500.0000 mg | ORAL_TABLET | Freq: Two times a day (BID) | ORAL | 0 refills | Status: AC

## 2023-10-13 MED ORDER — IBUPROFEN 400 MG PO TABS
600.0000 mg | ORAL_TABLET | Freq: Once | ORAL | Status: AC
  Administered 2023-10-13: 600 mg via ORAL
  Filled 2023-10-13: qty 1

## 2023-10-13 NOTE — ED Triage Notes (Addendum)
 Pt. Stated, Ive had blood in stool for 2 days with some nausea and stomach pain comes and goes away. Been on Levaquin since Feb. 28, last dose was today for cold and chills.

## 2023-10-13 NOTE — Discharge Instructions (Signed)
 You were seen in the emergency department for bloody stool The CAT scan showed something called epiploic appendagitis which does not require surgery We gave you antibiotics to go home on in case there is an infection here but it does not appear as though it is severe infection at this time Pick up the antibiotic from her pharmacy and begin taking as directed Take the antibiotics with a probiotic to help reduce side effects The CAT scan also showed an inguinal hernia which you know about and are seeing a surgeon for on Monday Please keep this appointment Call the number to be seen in the gastroenterology office at [facility] if your symptoms persist Return to the emerged part for severe pain severe bleeding or any other concerns

## 2023-10-13 NOTE — ED Notes (Signed)
 Spoke with CT who state patient is up next for scan.

## 2023-10-13 NOTE — ED Provider Notes (Signed)
 [place] EM[facility]ENC[facility] DEPARTMENT AT [facility] Provider Note   CSN: [id number] Arrival date & time: 10/13/23  0865     History  Chief Complaint  Patient presents with   Abdominal Pain   Nausea   Blood In Stools    Jake Fisher is a 63 y.o. male.  With a history of hypertension, type 2 diabetes and hyperlipidemia presents to the ED for GI bleeding.  He reports 2 days of nausea lower abdominal pain and bloody stools.  Stools have been brown with dark red blood streaked.  Is currently on Levaquin for treatment of sinusitis.  Denies fevers chills chest pain shortness of breath.  Patient reports bloody stools about 1 year ago which resolved spontaneously.  No appreciable etiology of GI bleeding in the past.   Abdominal Pain      Home Medications Prior to Admission medications   Medication Sig Start Date End Date Taking? Authorizing Provider  aspirin EC 81 MG tablet Take 81 mg by mouth daily. 05/26/23 05/25/24 Yes [provider]  cetirizine (ZYRTEC) 10 MG tablet Take 10 mg by mouth daily. 10/07/23 10/06/24 Yes [provider]  Cholecalciferol (VITAMIN D-3 PO) Take 1 capsule by mouth daily.   Yes [provider]  ciprofloxacin (CIPRO) 500 MG tablet Take 1 tablet (500 mg total) by mouth every 12 (twelve) hours for 4 days. 10/13/23 10/17/23 Yes Royanne Foots, DO  fluticasone (FLONASE) 50 MCG/ACT nasal spray Place 1 spray into both nostrils daily. 10/07/23 10/06/24 Yes [provider]  guaiFENesin (MUCINEX) 600 MG 12 hr tablet Take 1,200 mg by mouth 2 (two) times daily.   Yes [provider]  levofloxacin (LEVAQUIN) 750 MG tablet Take 750 mg by mouth daily. X7 days 10/07/23 10/14/23 Yes [provider]  metFORMIN (GLUCOPHAGE-XR) 500 MG 24 hr tablet Take 500 mg by mouth daily. 06/20/23 06/19/24 Yes [provider]  metoprolol succinate (TOPROL-XL) 50 MG 24 hr tablet Take 50 mg by mouth daily.   Yes [provider]   metroNIDAZOLE (FLAGYL) 500 MG tablet Take 1 tablet (500 mg total) by mouth 2 (two) times daily for 4 days. 10/13/23 10/17/23 Yes Royanne Foots, DO  naproxen sodium (ALEVE) 220 MG tablet Take 220-440 mg by mouth 2 (two) times daily as needed (Pain).   Yes [provider]  rosuvastatin (CRESTOR) 20 MG tablet Take 20 mg by mouth at bedtime. 07/26/22 10/13/23 Yes [provider]      Allergies    Augmentin [amoxicillin-pot clavulanate]    Review of Systems   Review of Systems  Gastrointestinal:  Positive for abdominal pain.    Physical Exam Updated Vital Signs BP (!) 163/96   Pulse 79   Temp 98 F (36.7 C) (Oral)   Resp 15   Ht 5\' 7"  (1.702 m)   Wt 96.2 kg   SpO2 100%   BMI 33.20 kg/m  Physical Exam Vitals and nursing note reviewed.  HENT:     Head: Normocephalic and atraumatic.  Eyes:     Pupils: Pupils are equal, round, and reactive to light.  Cardiovascular:     Rate and Rhythm: Normal rate and regular rhythm.  Pulmonary:     Effort: Pulmonary effort is normal.     Breath sounds: Normal breath sounds.  Abdominal:     Palpations: Abdomen is soft.     Tenderness: There is abdominal tenderness in the left lower quadrant.  Genitourinary:    Rectum: Normal. Guaiac result positive.  Skin:    General: Skin is warm and dry.  Neurological:     Mental Status: He is alert.  Psychiatric:        Mood and Affect: Mood normal.     ED Results / Procedures / Treatments   Labs (all labs ordered are listed, but only abnormal results are displayed) Labs Reviewed  CBC WITH DIFFERENTIAL/PLATELET - Abnormal; Notable for the following components:      Result Value   RBC 3.94 (*)    Hemoglobin 12.0 (*)    HCT 37.1 (*)    All other components within normal limits  BASIC METABOLIC PANEL - Abnormal; Notable for the following components:   Glucose, Bld 123 (*)    All other components within normal limits  COMPREHENSIVE METABOLIC PANEL - Abnormal; Notable for the  following components:   Glucose, Bld 116 (*)    Creatinine, Ser 1.25 (*)    Albumin 3.4 (*)    All other components within normal limits  POC OCCULT BLOOD, ED - Abnormal; Notable for the following components:   Fecal Occult Bld POSITIVE (*)    All other components within normal limits  RESP PANEL BY RT-PCR (RSV, FLU A&B, COVID)  RVPGX2  URINALYSIS, ROUTINE W REFLEX MICROSCOPIC  LIPASE, BLOOD  LACTIC ACID, PLASMA  TYPE AND SCREEN    EKG None  Radiology CT ABDOMEN PELVIS W CONTRAST Result Date: 10/13/2023 CLINICAL DATA:  Left lower quadrant abdominal pain and bloody stools. EXAM: CT ABDOMEN AND PELVIS WITH CONTRAST TECHNIQUE: Multidetector CT imaging of the abdomen and pelvis was performed using the standard protocol following bolus administration of intravenous contrast. RADIATION DOSE REDUCTION: This exam was performed according to the departmental dose-optimization program which includes automated exposure control, adjustment of the mA and/or kV according to patient size and/or use of iterative reconstruction technique. CONTRAST:  75mL OMNIPAQUE IOHEXOL 350 MG/ML SOLN COMPARISON:  CT abdomen pelvis dated Dec 15, 2022. FINDINGS: Lower chest: No acute abnormality. Hepatobiliary: No focal liver abnormality is seen. No gallstones, gallbladder wall thickening, or biliary dilatation. Pancreas: Unremarkable. No pancreatic ductal dilatation or surrounding inflammatory changes. Spleen: Normal in size without focal abnormality. Adrenals/Urinary Tract: Adrenal glands are unremarkable. Kidneys are normal, without renal calculi, focal lesion, or hydronephrosis. Thick-walled, decompressed bladder. Stomach/Bowel: The stomach and small bowel are unremarkable. Large left inguinal hernia containing fat and non-dilated sigmoid colon, slightly increased in size compared to prior study. The entire loop of herniated colon is not included in the field of view. There is mild inflammatory change around the proximal  sigmoid colon where it first enters the hernia sac, with a prominent epiploic appendage (series 3, images 58-62). Mild left-sided colonic diverticulosis. No bowel wall thickening or obstruction. Normal appendix. Vascular/Lymphatic: No significant vascular findings are present. No enlarged abdominal or pelvic lymph nodes. Reproductive: Unchanged mild prostatomegaly. Other: No free fluid or pneumoperitoneum. Musculoskeletal: No acute or significant osseous findings. IMPRESSION: 1. Large left inguinal hernia containing fat and non-dilated sigmoid colon, slightly increased in size compared to prior study. The entire loop of herniated colon is not included in the field of view. Mild inflammatory change around the proximal sigmoid colon where it first enters the hernia sac, with a prominent epiploic appendage, suspicious for epiploic appendagitis. No bowel wall thickening or obstruction. Electronically Signed   By: Obie Dredge M.D.   On: 10/13/2023 13:57    Procedures Procedures    Medications Ordered in ED Medications  iohexol (OMNIPAQUE) 350 MG/ML injection 75 mL (75 mLs  Intravenous Contrast Given 10/13/23 1225)  ciprofloxacin (CIPRO) tablet 500 mg (500 mg Oral Given 10/13/23 1421)  metroNIDAZOLE (FLAGYL) tablet 500 mg (500 mg Oral Given 10/13/23 1421)  ibuprofen (ADVIL) tablet 600 mg (600 mg Oral Given 10/13/23 1421)    ED Course/ Medical Decision Making/ A&P Clinical Course as of 10/13/23 1427  Thu Oct 13, 2023  1405 Laboratory workup shows no leukocytosis.  Fecal occult is positive.  UA negative.  COVID RSV influenza all negative.  CT abdomen pelvis revealsMild inflammatory change around the proximal sigmoid colon where it first enters the hernia sac, with a prominent epiploic appendage,suspicious for epiploic appendagitis Considering surrounding inflammatory changes will discharge on course of outpatient antibiotics and instruct for PCP follow-up instructed for symptomatic management with NSAIDs.   [MP]  1427 Informed patient and family member of CT findings.  He has an appointment to see a surgeon in the office in 3 days to discuss management of hernia Will provide him the number for GI follow-up if his symptoms persist [MP]    Clinical Course User Index [MP] Royanne Foots, DO                                 Medical Decision Making 63 year old male with history as above presenting for GI bleeding nausea lower abdominal pain x 2 days.  Reports history of bloody stools 1 year ago but no appreciable cause.  Currently on Levaquin for sinusitis.  Afebrile slightly hypertensive here.  Exam notable for left lower quadrant tenderness and guaiac positive stool.   Differential diagnosis includes: Diverticulosis Acute intra-abdominal infectious/inflammatory process such as appendicitis, diverticulitis, pancreatitis and cholecystitis Urinary tract infection Viral gastroenteritis Internal hemorrhoid  Will obtain laboratory workup including CBC with differential, metabolic panel, lipase, type and screen, lactic acid and urinalysis along with CT abdomen pelvis with IV contrast  Amount and/or Complexity of Data Reviewed Labs: ordered. Radiology: ordered.  Risk Prescription drug management.           Final Clinical Impression(s) / ED Diagnoses Final diagnoses:  Epiploic appendagitis  Blood in stool    Rx / DC Orders ED Discharge Orders          Ordered    ciprofloxacin (CIPRO) 500 MG tablet  Every 12 hours        10/13/23 1425    metroNIDAZOLE (FLAGYL) 500 MG tablet  2 times daily        10/13/23 1425              Estelle June A, DO 10/13/23 1428

## 2023-10-13 NOTE — ED Notes (Signed)
 Patient transported to CT

## 2024-07-09 ENCOUNTER — Other Ambulatory Visit: Payer: Self-pay | Admitting: Family Medicine

## 2024-07-09 DIAGNOSIS — N5089 Other specified disorders of the male genital organs: Secondary | ICD-10-CM
